# Patient Record
Sex: Female | Born: 1975 | Race: White | Hispanic: No | Marital: Married | Smoking: Never smoker
Health system: Southern US, Community
[De-identification: ages and names within clinical notes are randomized; demographics above are authoritative.]

## PROBLEM LIST (undated history)

## (undated) DIAGNOSIS — F32A Depression, unspecified: Secondary | ICD-10-CM

## (undated) DIAGNOSIS — I10 Essential (primary) hypertension: Secondary | ICD-10-CM

## (undated) DIAGNOSIS — F329 Major depressive disorder, single episode, unspecified: Secondary | ICD-10-CM

## (undated) DIAGNOSIS — E0590 Thyrotoxicosis, unspecified without thyrotoxic crisis or storm: Secondary | ICD-10-CM

## (undated) DIAGNOSIS — K649 Unspecified hemorrhoids: Secondary | ICD-10-CM

## (undated) HISTORY — DX: Depression, unspecified: F32.A

## (undated) HISTORY — DX: Major depressive disorder, single episode, unspecified: F32.9

## (undated) HISTORY — DX: Essential (primary) hypertension: I10

## (undated) HISTORY — DX: Unspecified hemorrhoids: K64.9

## (undated) HISTORY — DX: Thyrotoxicosis, unspecified without thyrotoxic crisis or storm: E05.90

## (undated) HISTORY — PX: COLONOSCOPY: SHX174

---
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
## 2007-10-03 ENCOUNTER — Ambulatory Visit: Payer: Self-pay | Admitting: Internal Medicine

## 2007-11-23 DIAGNOSIS — E0590 Thyrotoxicosis, unspecified without thyrotoxic crisis or storm: Secondary | ICD-10-CM

## 2007-11-23 HISTORY — DX: Thyrotoxicosis, unspecified without thyrotoxic crisis or storm: E05.90

## 2007-12-19 ENCOUNTER — Ambulatory Visit: Payer: Self-pay | Admitting: Internal Medicine

## 2010-01-27 ENCOUNTER — Ambulatory Visit: Payer: Self-pay | Admitting: Family Medicine

## 2010-12-23 HISTORY — PX: DILATION AND CURETTAGE OF UTERUS: SHX78

## 2010-12-28 ENCOUNTER — Ambulatory Visit: Payer: Self-pay

## 2011-01-07 ENCOUNTER — Ambulatory Visit: Payer: Self-pay

## 2011-01-08 LAB — PATHOLOGY REPORT

## 2011-08-23 HISTORY — PX: ABDOMINAL HYSTERECTOMY: SHX81

## 2013-01-03 ENCOUNTER — Ambulatory Visit: Payer: Self-pay | Admitting: Internal Medicine

## 2013-01-04 ENCOUNTER — Ambulatory Visit: Payer: Self-pay | Admitting: Internal Medicine

## 2013-01-25 ENCOUNTER — Telehealth: Payer: Self-pay | Admitting: Internal Medicine

## 2013-01-25 MED ORDER — METOPROLOL SUCCINATE ER 50 MG PO TB24: 50.0000 mg | ORAL_TABLET | Freq: Every day | ORAL | Status: DC

## 2013-01-25 NOTE — Telephone Encounter (Signed)
Pt is a previous pt of Dr. Roby Lofts.  Pt had appt to see Dr. Lorin Picket on 2/13 which was rescheduled due to weather.  Pt is not on schedule to see Dr. Lorin Picket as a New pt to this facility until 03/02/13.  Pt is out of her Toprol XL 50 mg twice a day.  [facility] or [facility] [address]  [facility] so that she can get the Rx sooner if Dr. Lorin Picket can fill this for her.

## 2013-01-25 NOTE — Telephone Encounter (Signed)
Filled script for 68 pills and until next appointment. Corrected one pill a day to bid with [facility]

## 2013-01-25 NOTE — Telephone Encounter (Signed)
Ok to refill toprol #30 here locally and can send in #90 no refills - to get her through to her appt

## 2013-01-25 NOTE — Telephone Encounter (Signed)
Dr. Lorin Picket,  Patient has no last ov and new patient appointment is 4/11. Will you authorize  the toprol xl 50 mg bid?   Asher Muir

## 2013-03-01 ENCOUNTER — Encounter: Payer: Self-pay | Admitting: Internal Medicine

## 2013-03-01 ENCOUNTER — Ambulatory Visit (INDEPENDENT_AMBULATORY_CARE_PROVIDER_SITE_OTHER): Payer: 59 | Admitting: Internal Medicine

## 2013-03-01 VITALS — BP 126/80 | HR 96 | Temp 97.9°F | Resp 18 | Ht 68.0 in | Wt 320.8 lb

## 2013-03-01 DIAGNOSIS — E7800 Pure hypercholesterolemia, unspecified: Secondary | ICD-10-CM

## 2013-03-01 DIAGNOSIS — E039 Hypothyroidism, unspecified: Secondary | ICD-10-CM

## 2013-03-01 DIAGNOSIS — R7309 Other abnormal glucose: Secondary | ICD-10-CM

## 2013-03-01 DIAGNOSIS — R739 Hyperglycemia, unspecified: Secondary | ICD-10-CM

## 2013-03-01 DIAGNOSIS — I10 Essential (primary) hypertension: Secondary | ICD-10-CM

## 2013-03-01 MED ORDER — LISINOPRIL-HYDROCHLOROTHIAZIDE 20-25 MG PO TABS: 1.0000 | ORAL_TABLET | Freq: Every day | ORAL | Status: DC

## 2013-03-01 MED ORDER — METOPROLOL SUCCINATE ER 50 MG PO TB24: 50.0000 mg | ORAL_TABLET | Freq: Two times a day (BID) | ORAL | Status: DC

## 2013-03-01 MED ORDER — LEVOTHYROXINE SODIUM 175 MCG PO TABS: 175.0000 ug | ORAL_TABLET | Freq: Every day | ORAL | Status: DC

## 2013-03-01 MED ORDER — SERTRALINE HCL 50 MG PO TABS: 50.0000 mg | ORAL_TABLET | Freq: Every day | ORAL | Status: DC

## 2013-03-02 ENCOUNTER — Ambulatory Visit: Payer: Self-pay | Admitting: Internal Medicine

## 2013-03-04 ENCOUNTER — Encounter: Payer: Self-pay | Admitting: Internal Medicine

## 2013-03-04 DIAGNOSIS — R739 Hyperglycemia, unspecified: Secondary | ICD-10-CM | POA: Insufficient documentation

## 2013-03-04 DIAGNOSIS — E7800 Pure hypercholesterolemia, unspecified: Secondary | ICD-10-CM | POA: Insufficient documentation

## 2013-03-04 DIAGNOSIS — I10 Essential (primary) hypertension: Secondary | ICD-10-CM | POA: Insufficient documentation

## 2013-03-04 DIAGNOSIS — E039 Hypothyroidism, unspecified: Secondary | ICD-10-CM | POA: Insufficient documentation

## 2013-03-04 NOTE — Assessment & Plan Note (Signed)
Had a history of hyperthyroidism and is s/p ablation.  Now hypothyroid.  Will check tsh. Continue thyroid replacement.

## 2013-03-04 NOTE — Assessment & Plan Note (Signed)
Low cholesterol diet and exercise.  Check lipid panel.   

## 2013-03-04 NOTE — Assessment & Plan Note (Signed)
Low carb diet, exercise and weight loss.  Check fasting glucose.  Follow.

## 2013-03-04 NOTE — Progress Notes (Signed)
  Subjective:    Patient ID: Jenna Winters, female    DOB: 1975-12-13, 37 y.o.   MRN: [id number]  HPI 37 year old female with past history of hypertension, hypercholesterolemia and hyperthyroidism s/p ablation with resulting hypothyroidism.  She comes in today for a scheduled follow up and to transfer her care here to [facility].  I previously saw her at [facility].  She states overall she is doing relatively well.  We had tapered her off her Zoloft.  She feels she needs to be back on it.  Increased stress at work.  She also is only taking one toprol per day.  Blood pressure averaging 120s/80.  No headache.  Breathing stable.  Is s/p hysterectomy.  Has done well.  Bowels stable.    Past Medical History  Diagnosis Date  . Hypertension   . Hyperthyroidism 2009    s/p ablation with resulting hypothyroidism  . Depression     Review of Systems Patient denies any headache, lightheadedness or dizziness.  No sinus or allergy symptoms.  No chest pain, tightness or palpitations.  No increased shortness of breath, cough or congestion.  No acid reflux.  No nausea or vomiting.  No abdominal pain or cramping.  No bowel change, such as diarrhea, constipation, BRBPR or melana.  No urine change.  Increased stress.  Feels she needs to be back on her zoloft.  Blood pressures as outlined.       Objective:   Physical Exam Filed Vitals:   03/01/13 1016  BP: 126/80  Pulse: 96  Temp: 97.9 F (36.6 C)  Resp: 18   Blood pressure recheck:  114/80, pulse 60  37 year old female in no acute distress.   HEENT:  Nares- clear.  Oropharynx - without lesions. NECK:  Supple.  Nontender.  No audible bruit.  HEART:  Appears to be regular. LUNGS:  No crackles or wheezing audible.  Respirations even and unlabored.  RADIAL PULSE:  Equal bilaterally.   ABDOMEN:  Soft, nontender.  Bowel sounds present and normal.  No audible abdominal bruit.   EXTREMITIES:  No increased edema present.  DP pulses palpable and equal  bilaterally.           Assessment & Plan:  GYN.  Is s/p hysterectomy.  Doing well.  Follow.   INCREASED PSYCHOSOCIAL STRESSORS.  Had tapered off Zoloft.  Increased stress at work.  Feels she needs to be back on the Zoloft.  Restart Zoloft 50mg  q day.  Follow.  Get her back in soon to reassess.    HEALTH MAINTENANCE.  GYN exams through Dr Luella Cook.  Need to obtain report of baseline mammogram.

## 2013-03-04 NOTE — Assessment & Plan Note (Signed)
Blood pressure as outlined.  Continue same med regimen. For now, will leave her on the Toprol XL q day.  Follow.  Check metabolic panel.

## 2013-04-30 ENCOUNTER — Telehealth: Payer: Self-pay | Admitting: Internal Medicine

## 2013-04-30 ENCOUNTER — Other Ambulatory Visit: Payer: Self-pay | Admitting: *Deleted

## 2013-04-30 MED ORDER — HYDROCORTISONE 2.5 % RE CREA: TOPICAL_CREAM | Freq: Two times a day (BID) | RECTAL | Status: DC | PRN

## 2013-04-30 NOTE — Telephone Encounter (Signed)
Not on current medication list-okay to refill?

## 2013-04-30 NOTE — Telephone Encounter (Signed)
Done-pt informed 

## 2013-04-30 NOTE — Telephone Encounter (Signed)
Pt states that she hasn't had to use it in a while. She usually uses the Preparation HC. She has an external Hemorrhoid x 5 days. Not getting any better with the otc cream.

## 2013-04-30 NOTE — Telephone Encounter (Signed)
Please find out what she is needing this for.  If having acute issues I need to see her.  Let me know and I will work her in.

## 2013-04-30 NOTE — Telephone Encounter (Signed)
Ok to call in one refill - proctosol hc 2.5% - apply to affected area bid  1 week prn.  Let me know if persistent problems.

## 2013-04-30 NOTE — Telephone Encounter (Signed)
Pt called to get refill on proctosol -hc 2.5%  [facility]

## 2013-05-01 ENCOUNTER — Other Ambulatory Visit: Payer: Self-pay | Admitting: Internal Medicine

## 2013-05-02 LAB — COMPREHENSIVE METABOLIC PANEL
AST: 16 IU/L (ref 0–40)
Alkaline Phosphatase: 91 IU/L (ref 39–117)
BUN/Creatinine Ratio: 16 (ref 8–20)
CO2: 23 mmol/L (ref 19–28)
Chloride: 99 mmol/L (ref 97–108)
GFR calc Af Amer: 114 mL/min/{1.73_m2} (ref >59)
Sodium: 138 mmol/L (ref 134–144)
Total Bilirubin: 0.5 mg/dL (ref 0.0–1.2)

## 2013-05-02 LAB — CBC WITH DIFFERENTIAL
Basophils Absolute: 0 10*3/uL (ref 0.0–0.2)
Basos: 0 % (ref 0–3)
Eos: 7 % — ABNORMAL HIGH (ref 0–5)
Eosinophils Absolute: 0.5 10*3/uL — ABNORMAL HIGH (ref 0.0–0.4)
HCT: 40.4 % (ref 34.0–46.6)
Hemoglobin: 13.8 g/dL (ref 11.1–15.9)
Immature Grans (Abs): 0 10*3/uL (ref 0.0–0.1)
Immature Granulocytes: 0 % (ref 0–2)
Lymphocytes Absolute: 2.5 10*3/uL (ref 0.7–3.1)
Lymphs: 32 % (ref 14–46)
MCH: 30.5 pg (ref 26.6–33.0)
MCHC: 34.2 g/dL (ref 31.5–35.7)
MCV: 89 fL (ref 79–97)
Monocytes Absolute: 0.5 10*3/uL (ref 0.1–0.9)
Monocytes: 6 % (ref 4–12)
Neutrophils Absolute: 4.3 10*3/uL (ref 1.4–7.0)
Neutrophils Relative %: 55 % (ref 40–74)
Platelets: 258 10*3/uL (ref 155–379)
RBC: 4.53 x10E6/uL (ref 3.77–5.28)
RDW: 13.7 % (ref 12.3–15.4)
WBC: 7.8 10*3/uL (ref 3.4–10.8)

## 2013-05-02 LAB — LIPID PANEL W/O CHOL/HDL RATIO
Cholesterol, Total: 192 mg/dL (ref 100–199)
LDL Calculated: 135 mg/dL — ABNORMAL HIGH (ref 0–99)
Triglycerides: 90 mg/dL (ref 0–149)

## 2013-05-03 ENCOUNTER — Encounter: Payer: Self-pay | Admitting: *Deleted

## 2013-05-07 ENCOUNTER — Encounter: Payer: Self-pay | Admitting: Adult Health

## 2013-05-07 ENCOUNTER — Telehealth: Payer: Self-pay | Admitting: Internal Medicine

## 2013-05-07 ENCOUNTER — Encounter: Payer: Self-pay | Admitting: Gastroenterology

## 2013-05-07 ENCOUNTER — Ambulatory Visit (INDEPENDENT_AMBULATORY_CARE_PROVIDER_SITE_OTHER): Payer: 59 | Admitting: Adult Health

## 2013-05-07 VITALS — BP 110/78 | HR 84 | Temp 98.5°F | Resp 12 | Wt 321.0 lb

## 2013-05-07 DIAGNOSIS — K625 Hemorrhage of anus and rectum: Secondary | ICD-10-CM

## 2013-05-07 DIAGNOSIS — K644 Residual hemorrhoidal skin tags: Secondary | ICD-10-CM

## 2013-05-07 NOTE — Assessment & Plan Note (Signed)
1 large external swollen hemorrhoid. She has been using hydrocortisone 2.5% cream x7 days which is the time period recommended. Use Preparation H., Sitz bath and refer to GI for further evaluation.

## 2013-05-07 NOTE — Telephone Encounter (Signed)
Patient Information:  Caller Name: [place]  Phone: [phone]  Patient: Jenna Winters  Gender: Female  DOB: [birth year]  Age: 37 Years  PCP: Dale Peyton  Pregnant: No  Office Follow Up:  Does the office need to follow up with this patient?: No  Instructions For The Office: N/A  RN Note:  Pt has blood after BM, Pt notices moderate amoun while wiping.  Pt denies dizziness or lightheadedness. Pt has been treated for hemorrhoids in the past but was advised against surgery.  All emergent symptoms ruled out per Rectal Bleeding protocol. Pt has appt at 1430 prior to call.  Symptoms  Reason For Call & Symptoms: ER CALL. Hemorrhoid Flare-up w/ Rectal bleeding.  Reviewed Health History In EMR: N/A  Reviewed Medications In EMR: N/A  Reviewed Allergies In EMR: N/A  Reviewed Surgeries / Procedures: N/A  Date of Onset of Symptoms: 05/05/2013  Treatments Tried: Proctosol  Treatments Tried Worked: No OB / GYN:  LMP: Unknown  Guideline(s) Used:  Rectal Bleeding  Disposition Per Guideline:   See Today in Office  Reason For Disposition Reached:   All other patients with rectal bleeding (Exceptions: blood just on toilet paper, few drops, streaks on surface of normal formed BM)  Advice Given:  N/A  Patient Will Follow Care Advice:  YES  Appointment Scheduled:  05/07/2013 14:30:00 Appointment Scheduled Provider:  N/A

## 2013-05-07 NOTE — Patient Instructions (Addendum)
  Use preparation H for your hemorrhoidal discomfort.  Continue eating high fiber diet.  Sit in cool water basin several times a day for relief.  I am referring you to GI for further evaluation and management.

## 2013-05-07 NOTE — Telephone Encounter (Signed)
Patient coming to see  Raquel today

## 2013-05-07 NOTE — Progress Notes (Signed)
  Subjective:    Patient ID: Jenna Winters, female    DOB: [birth year], 37 y.o.   MRN: [id number]  HPI  Patient is a pleasant 37 year old female who presents to clinic with complaints of rectal pain and bleeding for greater than one week. Patient has history of external hemorrhoids. She reports itching and great discomfort with sitting. She denies current constipation although she has had a history of this in the past. Denies any change in her diet.   Current Outpatient Prescriptions on File Prior to Visit  Medication Sig Dispense Refill  . hydrocortisone (ANUSOL-HC) 2.5 % rectal cream Place rectally 2 (two) times daily as needed for hemorrhoids.  30 g  0  . levothyroxine (SYNTHROID, LEVOTHROID) 175 MCG tablet Take 1 tablet (175 mcg total) by mouth daily before breakfast.  90 tablet  1  . lisinopril-hydrochlorothiazide (PRINZIDE,ZESTORETIC) 20-25 MG per tablet Take 1 tablet by mouth daily.  90 tablet  1  . metoprolol succinate (TOPROL-XL) 50 MG 24 hr tablet Take 1 tablet (50 mg total) by mouth 2 (two) times daily. Take with or immediately following a meal.  180 tablet  1  . sertraline (ZOLOFT) 50 MG tablet Take 1 tablet (50 mg total) by mouth daily.  90 tablet  1   No current facility-administered medications on file prior to visit.     Review of Systems  Constitutional: Negative for fever and chills.  Gastrointestinal: Positive for blood in stool, anal bleeding and rectal pain. Negative for nausea, abdominal pain, constipation and abdominal distention.       Hemorrhoid       Objective:   Physical Exam  Constitutional: She is oriented to person, place, and time.  Overweight, pleasant female in no apparent distress  Genitourinary:  1 large external hemorrhoid erythematous and inflamed  Neurological: She is alert and oriented to person, place, and time.  Psychiatric: She has a normal mood and affect. Her behavior is normal. Judgment and thought content normal.           Assessment & Plan:

## 2013-05-15 ENCOUNTER — Ambulatory Visit (INDEPENDENT_AMBULATORY_CARE_PROVIDER_SITE_OTHER): Payer: 59 | Admitting: Gastroenterology

## 2013-05-15 ENCOUNTER — Encounter: Payer: Self-pay | Admitting: Gastroenterology

## 2013-05-15 VITALS — BP 106/80 | HR 82 | Ht 68.0 in | Wt 322.4 lb

## 2013-05-15 DIAGNOSIS — K6289 Other specified diseases of anus and rectum: Secondary | ICD-10-CM

## 2013-05-15 DIAGNOSIS — K644 Residual hemorrhoidal skin tags: Secondary | ICD-10-CM

## 2013-05-15 MED ORDER — NA SULFATE-K SULFATE-MG SULF 17.5-3.13-1.6 GM/177ML PO SOLN: ORAL | Status: DC

## 2013-05-15 MED ORDER — AMBULATORY NON FORMULARY MEDICATION: Status: DC

## 2013-05-15 NOTE — Progress Notes (Signed)
History of Present Illness:  This is a 37 year old Caucasian female who has had many years of" hemorrhoids" with recurrent rectal bleeding, rectal pain, and apparently has had a large posterior skin tag for many years with consideration of surgical removal several years ago.  She denies constipation, but has recently had bleeding, swelling, and discomfort in her anal and rectal area all alleviated with Anusol-HC cream and suppositories along with Preparation H.  She currently doing much better symptomatically but continues to have a large protruding skin tag in the anal area.  She denies abdominal pain, upper GI or hepatobiliary complaints.  Her appetite is good her weight is been stable.  There is no family history of colon cancer or colon polyps, but multiple members with hemorrhoids.  She's not had previous barium enema or colonoscopy exam.  I have reviewed this patient's present history, medical and surgical past history, allergies and medications.     ROS:   All systems were reviewed and are negative unless otherwise stated in the HPI... no shortness of breath, chest pain with exertion, history of gallbladder liver disease or other complications of obesity.  No Known Allergies Outpatient Prescriptions Prior to Visit  Medication Sig Dispense Refill  . hydrocortisone (ANUSOL-HC) 2.5 % rectal cream Place rectally 2 (two) times daily as needed for hemorrhoids.  30 g  0  . levothyroxine (SYNTHROID, LEVOTHROID) 175 MCG tablet Take 1 tablet (175 mcg total) by mouth daily before breakfast.  90 tablet  1  . lisinopril-hydrochlorothiazide (PRINZIDE,ZESTORETIC) 20-25 MG per tablet Take 1 tablet by mouth daily.  90 tablet  1  . metoprolol succinate (TOPROL-XL) 50 MG 24 hr tablet Take 1 tablet (50 mg total) by mouth 2 (two) times daily. Take with or immediately following a meal.  180 tablet  1  . sertraline (ZOLOFT) 50 MG tablet Take 1 tablet (50 mg total) by mouth daily.  90 tablet  1   No  facility-administered medications prior to visit.   Past Medical History  Diagnosis Date  . Hypertension   . Hyperthyroidism 2009    s/p ablation with resulting hypothyroidism  . Depression    Past Surgical History  Procedure Laterality Date  . Abdominal hysterectomy  08/2011  . Dilation and curettage of uterus  12/2010  . Dilation and curettage of uterus  1995   History   Social History  . Marital Status: Single    Spouse Name: N/A    Number of Children: 1  . 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 Smoking status: Never Smoker   . Smokeless tobacco: Never Used  . Alcohol Use: No  . Drug Use: No  . Sexually Active: None   Other Topics Concern  . None   Social History Narrative  . None   Family History  Problem Relation Age of Onset  . Hyperlipidemia Father   . Hypertension Father   . Diabetes Father   . Hyperlipidemia Paternal Grandmother   . Hypertension Paternal Grandmother   . Diabetes Paternal Grandmother   . Hyperlipidemia Paternal Grandfather   . Hypertension Paternal Grandfather   . Diabetes Paternal Grandfather   . Lung cancer Paternal Grandfather        Physical Exam: General well developed well nourished patient in no acute distress, appearing her stated age Eyes PERRLA, no icterus, fundoscopic exam per opthamologist Skin no lesions noted Neck supple, no adenopathy, no thyroid enlargement, no tenderness  Chest clear to percussion and auscultation Heart no significant murmurs, gallops or rubs noted Abdomen no hepatosplenomegaly masses or tenderness, BS normal.  Rectal inspection normal no fissures, or fistulae noted.  No masses or tenderness on digital exam. Stool guaiac negative.  There is a large posterior skin tag measuring approximately 2 cm x 0.5 cm.  This is easy maneuverable and is not fluctuant, but is slightly tender.  Anoscopic exam was difficult but I can see no active  seizure or any active bulging internal hemorrhoids.  Stool is guaiac-negative. Extremities no acute joint lesions, edema, phlebitis or evidence of cellulitis. Neurologic patient oriented x 3, cranial nerves intact, no focal neurologic deficits noted. Psychological mental status normal and normal affect.  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  I really cannot see any large external and internal hemorrhoids at this time.  I have set her up for colonoscopy to exclude other causes of rectal bleeding, and prescribe 0.125% nitroglycerin cream 3 times a day likely should she develop spasm and irritation of her anal area which I think is from recurrent seizures and her large skin tag.  We may need to give consideration to surgical removal of this anatomical source of her recurrent rectal pain.  I placed her on a high fiber diet with daily Benefiber 1 tablespoon twice a day, also patient information concerning hemorrhoids, fissures and their management.  At the time of colonoscopy for been irrigated better view of her rectum and anal canal area.  She was somewhat tender today making exam difficult today.  Patient does have morbid obesity with weight 322 pounds and BMI of 49.03.  She's had no complications from her obesity, and review of her labs shows normal CBC, metabolic and liver function tests.  No diagnosis found.

## 2013-05-15 NOTE — Patient Instructions (Signed)
You have been scheduled for a colonoscopy with propofol. Please follow written instructions given to you at your visit today.  Please pick up your prep kit at the pharmacy within the next 1-3 days. If you use inhalers (even only as needed), please bring them with you on the day of your procedure. Your physician has requested that you go to www.startemmi.com and enter the access code given to you at your visit today. This web site gives a general overview about your procedure. However, you should still follow specific instructions given to you by our office regarding your preparation for the procedure.  We have sent the following medications to [facility] for you to pick up at your convenience: Nitroglycerine ointment 0.125 %, please use as directed  Please purchase benefiber over the counter and take one tablespoon twice daily.  You watched a video today on hemorrhoids and information is below. Information on High Fiber diet is below for your information. __________________________________________________________________________________________________________________  Hemorrhoids Hemorrhoids are swollen veins around the rectum or anus. There are two types of hemorrhoids:   Internal hemorrhoids. These occur in the veins just inside the rectum. They may poke through to the outside and become irritated and painful.  External hemorrhoids. These occur in the veins outside the anus and can be felt as a painful swelling or hard lump near the anus. CAUSES  Pregnancy.   Obesity.   Constipation or diarrhea.   Straining to have a bowel movement.   Sitting for long periods on the toilet.  Heavy lifting or other activity that caused you to strain.  Anal intercourse. SYMPTOMS   Pain.   Anal itching or irritation.   Rectal bleeding.   Fecal leakage.   Anal swelling.   One or more lumps around the anus.  DIAGNOSIS  Your caregiver may be able to diagnose hemorrhoids  by visual examination. Other examinations or tests that may be performed include:   Examination of the rectal area with a gloved hand (digital rectal exam).   Examination of anal canal using a small tube (scope).   A blood test if you have lost a significant amount of blood.  A test to look inside the colon (sigmoidoscopy or colonoscopy). TREATMENT Most hemorrhoids can be treated at home. However, if symptoms do not seem to be getting better or if you have a lot of rectal bleeding, your caregiver may perform a procedure to help make the hemorrhoids get smaller or remove them completely. Possible treatments include:   Placing a rubber band at the base of the hemorrhoid to cut off the circulation (rubber band ligation).   Injecting a chemical to shrink the hemorrhoid (sclerotherapy).   Using a tool to burn the hemorrhoid (infrared light therapy).   Surgically removing the hemorrhoid (hemorrhoidectomy).   Stapling the hemorrhoid to block blood flow to the tissue (hemorrhoid stapling).  HOME CARE INSTRUCTIONS   Eat foods with fiber, such as whole grains, beans, nuts, fruits, and vegetables. Ask your doctor about taking products with added fiber in them (fibersupplements).  Increase fluid intake. Drink enough water and fluids to keep your urine clear or pale yellow.   Exercise regularly.   Go to the bathroom when you have the urge to have a bowel movement. Do not wait.   Avoid straining to have bowel movements.   Keep the anal area dry and clean. Use wet toilet paper or moist towelettes after a bowel movement.   Medicated creams and suppositories may be used or  applied as directed.   Only take over-the-counter or prescription medicines as directed by your caregiver.   Take warm sitz baths for 15 20 minutes, 3 4 times a day to ease pain and discomfort.   Place ice packs on the hemorrhoids if they are tender and swollen. Using ice packs between sitz baths may be  helpful.   Put ice in a plastic bag.   Place a towel between your skin and the bag.   Leave the ice on for 15 20 minutes, 3 4 times a day.   Do not use a donut-shaped pillow or sit on the toilet for long periods. This increases blood pooling and pain.  SEEK MEDICAL CARE IF:  You have increasing pain and swelling that is not controlled by treatment or medicine.  You have uncontrolled bleeding.  You have difficulty or you are unable to have a bowel movement.  You have pain or inflammation outside the area of the hemorrhoids. MAKE SURE YOU:  Understand these instructions.  Will watch your condition.  Will get help right away if you are not doing well or get worse. Document Released: 11/05/2000 Document Revised: 10/25/2012 Document Reviewed: 09/12/2012 ExitCare Patient Information 2014 ExitCare, [place]. _______________________________________________________________________  High-Fiber Diet Fiber is found in fruits, vegetables, and grains. A high-fiber diet encourages the addition of more whole grains, legumes, fruits, and vegetables in your diet. The recommended amount of fiber for adult males is 38 g per day. For adult females, it is 25 g per day. Pregnant and lactating women should get 28 g of fiber per day. If you have a digestive or bowel problem, ask your caregiver for advice before adding high-fiber foods to your diet. Eat a variety of high-fiber foods instead of only a select few type of foods.  PURPOSE  To increase stool bulk.  To make bowel movements more regular to prevent constipation.  To lower cholesterol.  To prevent overeating. WHEN IS THIS DIET USED?  It may be used if you have constipation and hemorrhoids.  It may be used if you have uncomplicated diverticulosis (intestine condition) and irritable bowel syndrome.  It may be used if you need help with weight management.  It may be used if you want to add it to your diet as a protective measure against  atherosclerosis, diabetes, and cancer. SOURCES OF FIBER  Whole-grain breads and cereals.  Fruits, such as apples, oranges, bananas, berries, prunes, and pears.  Vegetables, such as green peas, carrots, sweet potatoes, beets, broccoli, cabbage, spinach, and artichokes.  Legumes, such split peas, soy, lentils.  Almonds. FIBER CONTENT IN FOODS Starches and Grains / Dietary Fiber (g)  Cheerios, 1 cup / 3 g  Corn Flakes cereal, 1 cup / 0.7 g  Rice crispy treat cereal, 1 cup / 0.3 g  Instant oatmeal (cooked),  cup / 2 g  Frosted wheat cereal, 1 cup / 5.1 g  Brown, long-grain rice (cooked), 1 cup / 3.5 g  White, long-grain rice (cooked), 1 cup / 0.6 g  Enriched macaroni (cooked), 1 cup / 2.5 g Legumes / Dietary Fiber (g)  Baked beans (canned, plain, or vegetarian),  cup / 5.2 g  Kidney beans (canned),  cup / 6.8 g  Pinto beans (cooked),  cup / 5.5 g Breads and Crackers / Dietary Fiber (g)  Plain or honey graham crackers, 2 squares / 0.7 g  Saltine crackers, 3 squares / 0.3 g  Plain, salted pretzels, 10 pieces / 1.8 g  Whole-wheat bread, 1  slice / 1.9 g  White bread, 1 slice / 0.7 g  Raisin bread, 1 slice / 1.2 g  Plain bagel, 3 oz / 2 g  Flour tortilla, 1 oz / 0.9 g  Corn tortilla, 1 small / 1.5 g  Hamburger or hotdog bun, 1 small / 0.9 g Fruits / Dietary Fiber (g)  Apple with skin, 1 medium / 4.4 g  Sweetened applesauce,  cup / 1.5 g  Banana,  medium / 1.5 g  Grapes, 10 grapes / 0.4 g  Orange, 1 small / 2.3 g  Raisin, 1.5 oz / 1.6 g  Melon, 1 cup / 1.4 g Vegetables / Dietary Fiber (g)  Green beans (canned),  cup / 1.3 g  Carrots (cooked),  cup / 2.3 g  Broccoli (cooked),  cup / 2.8 g  Peas (cooked),  cup / 4.4 g  Mashed potatoes,  cup / 1.6 g  Lettuce, 1 cup / 0.5 g  Corn (canned),  cup / 1.6 g  Tomato,  cup / 1.1 g Document Released: 11/08/2005 Document Revised: 05/09/2012 Document Reviewed: 02/10/2012 ExitCare  Patient Information 2014 [place]. _______________________________________________________________________________________________                                               We are excited to introduce MyChart, a new best-in-class service that provides you online access to important information in your electronic medical record. We want to make it easier for you to view your health information - all in one secure location - when and where you need it. We expect MyChart will enhance the quality of care and service we provide.  When you register for MyChart, you can:    View your test results.    Request appointments and receive appointment reminders via email.    Request medication renewals.    View your medical history, allergies, medications and immunizations.    Communicate with your physician's office through a password-protected site.    Conveniently print information such as your medication lists.  To find out if MyChart is right for you, please talk to a member of our clinical staff today. We will gladly answer your questions about this free health and wellness tool.  If you are age 38 or older and want a member of your family to have access to your record, you must provide written consent by completing a proxy form available at our office. Please speak to our clinical staff about guidelines regarding accounts for patients younger than age 34.  As you activate your MyChart account and need any technical assistance, please call the MyChart technical support line at [phone]) or email your question to [email or web address]. If you email your question(s), please include your name, a return phone number and the best time to reach you.  If you have non-urgent health-related questions, you can send a message to our office through MyChart at [email or web address]. If you have a medical emergency, call 911.  Thank you for using MyChart as your new health  and wellness resource!   MyChart licensed from [employer],  [id number]. Patents Pending.

## 2013-05-21 ENCOUNTER — Encounter: Payer: Self-pay | Admitting: Gastroenterology

## 2013-05-21 ENCOUNTER — Ambulatory Visit (AMBULATORY_SURGERY_CENTER): Payer: 59 | Admitting: Gastroenterology

## 2013-05-21 VITALS — BP 147/73 | HR 77 | Temp 98.5°F | Resp 17 | Ht 68.0 in | Wt 322.0 lb

## 2013-05-21 DIAGNOSIS — K644 Residual hemorrhoidal skin tags: Secondary | ICD-10-CM

## 2013-05-21 DIAGNOSIS — K625 Hemorrhage of anus and rectum: Secondary | ICD-10-CM

## 2013-05-21 DIAGNOSIS — Z1211 Encounter for screening for malignant neoplasm of colon: Secondary | ICD-10-CM

## 2013-05-21 MED ORDER — SODIUM CHLORIDE 0.9 % IV SOLN: 500.0000 mL | INTRAVENOUS | Status: DC

## 2013-05-21 NOTE — Op Note (Signed)
[facility] [address] [place], [address]   COLONOSCOPY PROCEDURE REPORT  PATIENT: Jenna, Winters  MR#: [id number] BIRTHDATE: 21-Jan-1976 , 36  yrs. old GENDER: Female ENDOSCOPIST: Mardella Layman, MD, [facility] REFERRED BY: PROCEDURE DATE:  05/21/2013 PROCEDURE:   Colonoscopy, screening ASA CLASS:   Class III INDICATIONS:Average risk patient for colon cancer, Rectal Bleeding, and anal pain. MEDICATIONS: propofol (Diprivan) 300mg  IV  DESCRIPTION OF PROCEDURE:   After the risks and benefits and of the procedure were explained, informed consent was obtained.  A digital rectal exam revealed hemorrhoids.    The LB [id number] endoscope was introduced through the anus and advanced to the cecum, which was identified by both the appendix and ileocecal valve .  The quality of the prep was excellent, using MoviPrep . The instrument was then slowly withdrawn as the colon was fully examined.     COLON FINDINGS: External hemorrhoids were found.  Large perianal skin tag noted,tender,see pictures.  The colon was otherwise normal.  There was no diverticulosis, inflammation, polyps or cancers unless previously stated.     Retroflexed views revealed external hemorrhoids.     The scope was then withdrawn from the patient and the procedure completed.  COMPLICATIONS: There were no complications. ENDOSCOPIC IMPRESSION: 1.   External hemorrhoids and prominant perianal skin tag... 2.   The colon was otherwise normal ...no polyps,colitis noted  RECOMMENDATIONS: 1.  Continue current medications 2.  High fiber diet 3.  Metamucil or benefiber 4.  Continue current colorectal screening recommendations for "routine risk" patients with a repeat colonoscopy in 10 years. 5. Local anal care  REPEAT EXAM:  cc:  _______________________________ eSignedMardella Layman, MD, [facility] 05/21/2013 9:57 AM     PATIENT NAME:  Jenna Winters MR#: [id number]

## 2013-05-21 NOTE — Patient Instructions (Addendum)

## 2013-05-21 NOTE — Progress Notes (Signed)
Patient did not experience any of the following events: a burn prior to discharge; a fall within the facility; wrong site/side/patient/procedure/implant event; or a hospital transfer or hospital admission upon discharge from the facility. (G8907) Patient did not have preoperative order for IV antibiotic SSI prophylaxis. (G8918)  

## 2013-05-22 ENCOUNTER — Telehealth: Payer: Self-pay | Admitting: *Deleted

## 2013-05-22 NOTE — Telephone Encounter (Signed)
  Follow up Call-  Call back number 05/21/2013  Post procedure Call Back phone  # [phone]  Permission to leave phone message Yes     Patient questions:  Do you have a fever, pain , or abdominal swelling? no Pain Score  0 *  Have you tolerated food without any problems? yes  Have you been able to return to your normal activities? yes  Do you have any questions about your discharge instructions: Diet   no Medications  no Follow up visit  no  Do you have questions or concerns about your Care? no  Actions: * If pain score is 4 or above: No action needed, pain <4.

## 2013-05-23 ENCOUNTER — Telehealth: Payer: Self-pay | Admitting: *Deleted

## 2013-05-23 DIAGNOSIS — K644 Residual hemorrhoidal skin tags: Secondary | ICD-10-CM

## 2013-05-23 NOTE — Telephone Encounter (Signed)
Informed pt of [facility] appt on 06/04/13 with Dr Harlon Flor, be there at 2:20pm. Pt stated understanding.

## 2013-05-31 ENCOUNTER — Encounter: Payer: Self-pay | Admitting: Internal Medicine

## 2013-05-31 ENCOUNTER — Ambulatory Visit (INDEPENDENT_AMBULATORY_CARE_PROVIDER_SITE_OTHER): Payer: 59 | Admitting: Internal Medicine

## 2013-05-31 VITALS — BP 104/70 | HR 80 | Temp 98.8°F | Ht 68.0 in | Wt 324.2 lb

## 2013-05-31 DIAGNOSIS — E7800 Pure hypercholesterolemia, unspecified: Secondary | ICD-10-CM

## 2013-05-31 DIAGNOSIS — I10 Essential (primary) hypertension: Secondary | ICD-10-CM

## 2013-05-31 DIAGNOSIS — K644 Residual hemorrhoidal skin tags: Secondary | ICD-10-CM

## 2013-05-31 DIAGNOSIS — R7309 Other abnormal glucose: Secondary | ICD-10-CM

## 2013-05-31 DIAGNOSIS — R739 Hyperglycemia, unspecified: Secondary | ICD-10-CM

## 2013-05-31 DIAGNOSIS — E039 Hypothyroidism, unspecified: Secondary | ICD-10-CM

## 2013-05-31 MED ORDER — ZOLPIDEM TARTRATE 5 MG PO TABS: 5.0000 mg | ORAL_TABLET | Freq: Every evening | ORAL | Status: DC | PRN

## 2013-06-03 ENCOUNTER — Encounter: Payer: Self-pay | Admitting: Internal Medicine

## 2013-06-03 NOTE — Assessment & Plan Note (Signed)
Low carb diet, exercise and weight loss.  Follow.  

## 2013-06-03 NOTE — Assessment & Plan Note (Signed)
Had a history of hyperthyroidism and is s/p ablation.  Now hypothyroid.  Continue thyroid replacement.  Follow tsh.    

## 2013-06-03 NOTE — Progress Notes (Signed)
  Subjective:    Patient ID: Jenna Winters, female    DOB: 05/27/1976, 37 y.o.   MRN: [id number]  HPI 37 year old female with past history of hypertension, hypercholesterolemia and hyperthyroidism s/p ablation with resulting hypothyroidism.  She comes in today for a scheduled follow up.  She was experiencing Increased stress at work.  Was started back on zoloft last visit.  Doing better.  No headache.  Breathing stable.  Is s/p hysterectomy.  Has done well.  Bowels stable.  Was evaluated for hemorrhoid.  See Raquel's note and Dr Norval Gable note for details.  He gave her NTG.  Has f/u next week with [facility].  Planning for possible intervention.  Is better now, but still has some issues.  She is in a cycle of not sleeping now.  Feels she needs something to help break the cycle.  Does feel better on the zoloft.     Past Medical History  Diagnosis Date  . Hypertension   . Hyperthyroidism 2009    s/p ablation with resulting hypothyroidism  . Depression     Review of Systems Patient denies any headache, lightheadedness or dizziness.  No sinus or allergy symptoms.  No chest pain, tightness or palpitations.  No increased shortness of breath, cough or congestion.  No acid reflux.  No nausea or vomiting.  No abdominal pain or cramping.  No bowel change, such as diarrhea, constipation, BRBPR or melana.  No urine change.  Increased stress.  Doing better back on the Zoloft.  Not sleeping.  See above.       Objective:   Physical Exam  Filed Vitals:   05/31/13 0949  BP: 104/70  Pulse: 80  Temp: 98.8 F (37.1 C)   Blood pressure recheck:  112/80, pulse 66  37 year old female in no acute distress.   HEENT:  Nares- clear.  Oropharynx - without lesions. NECK:  Supple.  Nontender.  No audible bruit.  HEART:  Appears to be regular. LUNGS:  No crackles or wheezing audible.  Respirations even and unlabored.  RADIAL PULSE:  Equal bilaterally.   ABDOMEN:  Soft, nontender.  Bowel sounds  present and normal.  No audible abdominal bruit.   EXTREMITIES:  No increased edema present.  DP pulses palpable and equal bilaterally.           Assessment & Plan:  GYN.  Is s/p hysterectomy.  Doing well.  Follow.   INCREASED PSYCHOSOCIAL STRESSORS.  Back on Zoloft and doing better.  Not sleeping.  Will give her a prescription for ambien to have to break the cycle.  She has taken this previously and tolerated.  Follow.     HEALTH MAINTENANCE.  GYN exams through Dr Luella Cook.

## 2013-06-03 NOTE — Assessment & Plan Note (Signed)
Had a colonoscopy 05/21/13.  Found to have external hemorrhoid otherwise ok.  Planning to see Dr Romie Levee next week to f/u regarding the hemorrhoid.

## 2013-06-03 NOTE — Assessment & Plan Note (Signed)
Blood pressure as outlined.  Continue same med regimen. Follow.   

## 2013-06-03 NOTE — Assessment & Plan Note (Signed)
Low cholesterol diet and exercise.  Follow lipid panel.   

## 2013-06-04 ENCOUNTER — Ambulatory Visit (INDEPENDENT_AMBULATORY_CARE_PROVIDER_SITE_OTHER): Payer: Self-pay | Admitting: Surgery

## 2013-06-06 ENCOUNTER — Ambulatory Visit (INDEPENDENT_AMBULATORY_CARE_PROVIDER_SITE_OTHER): Payer: 59 | Admitting: General Surgery

## 2013-06-06 ENCOUNTER — Encounter (INDEPENDENT_AMBULATORY_CARE_PROVIDER_SITE_OTHER): Payer: Self-pay | Admitting: General Surgery

## 2013-06-06 VITALS — BP 128/72 | HR 72 | Temp 99.0°F | Resp 16 | Ht 68.0 in | Wt 322.8 lb

## 2013-06-06 DIAGNOSIS — K644 Residual hemorrhoidal skin tags: Secondary | ICD-10-CM

## 2013-06-06 NOTE — Progress Notes (Signed)
Chief Complaint  Patient presents with  . New Evaluation    eval anal skin tag    HISTORY: Jenna Winters is a 37 y.o. female who presents to the office with rectal bleeding about a month ago.  This caused her anal skin tag to swell and become very painful.  This is getting better.  Other symptoms include itching.  This had been occurring for a few weeks.  She has tried prep H and anusol in the past with good success.  She is not sure what makes the symptoms worse.   It is intermittent in nature.  Her bowel habits are regular and her bowel movements are soft.  She denies straining.  Her fiber intake is through a fiber supplement.  Her last colonoscopy was recently and showed no other reason for her bleeding.   Past Medical History  Diagnosis Date  . Hypertension   . Hyperthyroidism 2009    s/p ablation with resulting hypothyroidism  . Depression       Past Surgical History  Procedure Laterality Date  . Abdominal hysterectomy  08/2011  . Dilation and curettage of uterus  12/2010  . Dilation and curettage of uterus  1995  . Colonoscopy          Current Outpatient Prescriptions  Medication Sig Dispense Refill  . hydrocortisone (ANUSOL-HC) 2.5 % rectal cream Place rectally 2 (two) times daily as needed for hemorrhoids.  30 g  0  . levothyroxine (SYNTHROID, LEVOTHROID) 175 MCG tablet Take 1 tablet (175 mcg total) by mouth daily before breakfast.  90 tablet  1  . lisinopril-hydrochlorothiazide (PRINZIDE,ZESTORETIC) 20-25 MG per tablet Take 1 tablet by mouth daily.  90 tablet  1  . metoprolol succinate (TOPROL-XL) 50 MG 24 hr tablet Take 1 tablet (50 mg total) by mouth 2 (two) times daily. Take with or immediately following a meal.  180 tablet  1  . sertraline (ZOLOFT) 50 MG tablet Take 1 tablet (50 mg total) by mouth daily.  90 tablet  1  . zolpidem (AMBIEN) 5 MG tablet Take 1 tablet (5 mg total) by mouth at bedtime as needed for sleep.  15 tablet  0  . AMBULATORY NON FORMULARY MEDICATION  Medication Name: Nitroglycerine ointment 0.125 %  Apply a pea sized amount internally two to three times a day Dispense 30 GM zero refill  30 g  1   No current facility-administered medications for this visit.      No Known Allergies    Family History  Problem Relation Age of Onset  . Hyperlipidemia Father   . Hypertension Father   . Diabetes Father   . Hyperlipidemia Paternal Grandmother   . Hypertension Paternal Grandmother   . Diabetes Paternal Grandmother   . Hyperlipidemia Paternal Grandfather   . Hypertension Paternal Grandfather   . Diabetes Paternal Grandfather   . Lung cancer Paternal Grandfather   . COPD Paternal Grandfather     lung    History   Social History  . Marital Status: Single    Spouse Name: N/A    Number of Children: 1  . 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 Smoking status: Never Smoker   . Smokeless tobacco: Never Used  . Alcohol Use: Yes     Comment: rare occasion  . Drug Use: No  . Sexually Active: None   Other Topics Concern  . None   Social History Narrative  .  None      REVIEW OF SYSTEMS - PERTINENT POSITIVES ONLY: Review of Systems - General ROS: negative for - chills, fever or weight loss Hematological and Lymphatic ROS: negative for - bleeding problems, blood clots or bruising Respiratory ROS: no cough, shortness of breath, or wheezing Cardiovascular ROS: no chest pain or dyspnea on exertion Gastrointestinal ROS: positive for - blood in stools negative for - abdominal pain or change in bowel habits Genito-Urinary ROS: no dysuria, trouble voiding, or hematuria  EXAM: Filed Vitals:   06/06/13 1124  BP: 128/72  Pulse: 72  Temp: 99 F (37.2 C)  Resp: 16    General appearance: alert and cooperative Resp: clear to auscultation bilaterally Cardio: regular rate and rhythm GI: soft, non-tender; bowel sounds normal; no masses,  no organomegaly   Procedure:  Anoscopy Surgeon: Maisie Fus Diagnosis: anal pain  Assistant: Christella Scheuermann After the risks and benefits were explained, verbal consent was obtained for above procedure  Anesthesia: none Findings: large right posterior skin tag, grade 2 internal hemorrhoids.      ASSESSMENT AND PLAN: Jenna Winters is a 37 y.o. F with a recent hemorrhoid flare.  This is doing better on the fiber, but she is concerned that she will continue to have flares.  On exam she has a rather large skin tag.  I think it is reasonable to consider removal.  We discussed the risks and benefits of surgery, which are mainly pain, bleeding and recurrence.  She understands the importance of having regular BM's post op as well.  She will continue her fiber supplements, and I will perform a hemorrhoidectomy.  Patient agrees to this plan.    Vanita Panda, MD Colon and Rectal Surgery / General Surgery [facility]      Visit Diagnoses: 1. External hemorrhoid     Primary Care Physician: Charm Barges, MD

## 2013-06-06 NOTE — Patient Instructions (Signed)

## 2013-06-11 ENCOUNTER — Telehealth (INDEPENDENT_AMBULATORY_CARE_PROVIDER_SITE_OTHER): Payer: Self-pay | Admitting: General Surgery

## 2013-06-11 NOTE — Telephone Encounter (Signed)
Met with surgery scheduling went over financial responsible, patient will call back to schedule.

## 2013-06-14 ENCOUNTER — Encounter: Payer: Self-pay | Admitting: Internal Medicine

## 2013-08-21 ENCOUNTER — Telehealth (INDEPENDENT_AMBULATORY_CARE_PROVIDER_SITE_OTHER): Payer: Self-pay

## 2013-08-21 NOTE — Telephone Encounter (Signed)
Patient is asking if there is a bowel prep before surgery, She states Dr. Maisie Fus mentioned something about it. Please advise

## 2013-08-21 NOTE — Telephone Encounter (Signed)
I called the pt and had to leave a message to call us.  There is no bowel prep.

## 2013-08-22 HISTORY — PX: HEMORRHOID SURGERY: SHX153

## 2013-08-23 NOTE — Telephone Encounter (Signed)
Patient called back and was given below message.  Patient states understanding and agreeable at this time. 

## 2013-08-27 ENCOUNTER — Other Ambulatory Visit (INDEPENDENT_AMBULATORY_CARE_PROVIDER_SITE_OTHER): Payer: Self-pay | Admitting: *Deleted

## 2013-08-27 ENCOUNTER — Other Ambulatory Visit: Payer: Self-pay

## 2013-08-27 DIAGNOSIS — K644 Residual hemorrhoidal skin tags: Secondary | ICD-10-CM

## 2013-08-27 DIAGNOSIS — K648 Other hemorrhoids: Secondary | ICD-10-CM

## 2013-08-27 MED ORDER — LIDOCAINE HCL 2 % EX GEL: CUTANEOUS | Status: DC

## 2013-08-27 MED ORDER — DIAZEPAM 5 MG PO TABS: 5.0000 mg | ORAL_TABLET | Freq: Four times a day (QID) | ORAL | Status: DC | PRN

## 2013-08-27 MED ORDER — OXYCODONE-ACETAMINOPHEN 5-325 MG PO TABS: 1.0000 | ORAL_TABLET | ORAL | Status: DC | PRN

## 2013-09-11 ENCOUNTER — Encounter (INDEPENDENT_AMBULATORY_CARE_PROVIDER_SITE_OTHER): Payer: Self-pay | Admitting: General Surgery

## 2013-09-11 ENCOUNTER — Ambulatory Visit (INDEPENDENT_AMBULATORY_CARE_PROVIDER_SITE_OTHER): Payer: 59 | Admitting: General Surgery

## 2013-09-11 VITALS — BP 128/90 | HR 90 | Temp 98.6°F | Wt 315.0 lb

## 2013-09-11 DIAGNOSIS — Z9889 Other specified postprocedural states: Secondary | ICD-10-CM

## 2013-09-11 NOTE — Progress Notes (Signed)
Jenna Winters is a 37 y.o. female who is status post a hemorrhoidectomy on 10/8.  She is having increased pain over the past few days.  She is using the sitz baths and lidocaine cream.  She is having several BM's a day.  Hasn't been able to sleep much in the past few days.   Objective: There were no vitals filed for this visit.  General appearance: alert and cooperative GI: normal findings: soft, non-tender  Incision: appropriate healing, no signs of infection or abscess, unable to do DRE due to pain   Assessment: s/p  Patient Active Problem List   Diagnosis Date Noted  . External hemorrhoid 05/07/2013  . Essential hypertension, benign 03/04/2013  . Hyperglycemia 03/04/2013  . Hypercholesterolemia 03/04/2013  . Hypothyroidism (acquired) 03/04/2013   Path: benign hemorrhoid tissue   Plan: Recommended continuing treatment for now.  Will try Benadryl for sleep.  RTO in 2 weeks    .Vanita Panda, MD [facility], [place] [phone]   09/11/2013 9:34 AM

## 2013-09-11 NOTE — Patient Instructions (Signed)
Continue sitz baths and lidocaine cream for pain.  Use oxycodone as needed when these don't work.

## 2013-09-14 ENCOUNTER — Encounter (INDEPENDENT_AMBULATORY_CARE_PROVIDER_SITE_OTHER): Payer: Self-pay

## 2013-09-14 ENCOUNTER — Telehealth (INDEPENDENT_AMBULATORY_CARE_PROVIDER_SITE_OTHER): Payer: Self-pay

## 2013-09-14 NOTE — Telephone Encounter (Signed)
Ok, to go back to work on [employer].  Will send a note.

## 2013-09-14 NOTE — Telephone Encounter (Signed)
Pt called to say that she is ready to return to work on Monday, October 27.  Note can be faxed to [phone] ATTN: Kristen Loader.  Pt would also like a call when note is faxed.  Cell is [phone]

## 2013-09-14 NOTE — Telephone Encounter (Signed)
Pt notified that I am faxing note now.

## 2013-09-27 ENCOUNTER — Other Ambulatory Visit: Payer: Self-pay

## 2013-09-27 ENCOUNTER — Ambulatory Visit (INDEPENDENT_AMBULATORY_CARE_PROVIDER_SITE_OTHER): Payer: 59 | Admitting: General Surgery

## 2013-09-27 ENCOUNTER — Encounter (INDEPENDENT_AMBULATORY_CARE_PROVIDER_SITE_OTHER): Payer: Self-pay | Admitting: General Surgery

## 2013-09-27 VITALS — BP 136/68 | HR 84 | Temp 98.7°F | Resp 16 | Ht 67.5 in | Wt 320.4 lb

## 2013-09-27 DIAGNOSIS — Z9889 Other specified postprocedural states: Secondary | ICD-10-CM

## 2013-09-27 NOTE — Patient Instructions (Signed)
Return to the office as needed 

## 2013-09-27 NOTE — Progress Notes (Signed)
Jenna Winters is a 37 y.o. female who is here for a follow up visit regarding her hemorrhoid surgery.  She is feeling better.  She is having regular BM's.    Objective: Filed Vitals:   09/27/13 1539  BP: 136/68  Pulse: 84  Temp: 98.7 F (37.1 C)  Resp: 16    General appearance: alert and cooperative GI: normal findings: soft, non-tender DRE: no stenosis  Assessment and Plan: Doing well. RTO PRN    .Vanita Panda, MD [facility], [place] [phone]

## 2013-10-01 ENCOUNTER — Encounter: Payer: Self-pay | Admitting: Internal Medicine

## 2013-10-01 ENCOUNTER — Ambulatory Visit (INDEPENDENT_AMBULATORY_CARE_PROVIDER_SITE_OTHER): Payer: 59 | Admitting: Internal Medicine

## 2013-10-01 VITALS — BP 110/80 | HR 85 | Temp 98.5°F | Ht 67.5 in | Wt 320.4 lb

## 2013-10-01 DIAGNOSIS — R7309 Other abnormal glucose: Secondary | ICD-10-CM

## 2013-10-01 DIAGNOSIS — E039 Hypothyroidism, unspecified: Secondary | ICD-10-CM

## 2013-10-01 DIAGNOSIS — K644 Residual hemorrhoidal skin tags: Secondary | ICD-10-CM

## 2013-10-01 DIAGNOSIS — Z23 Encounter for immunization: Secondary | ICD-10-CM

## 2013-10-01 DIAGNOSIS — I10 Essential (primary) hypertension: Secondary | ICD-10-CM

## 2013-10-01 DIAGNOSIS — E7800 Pure hypercholesterolemia, unspecified: Secondary | ICD-10-CM

## 2013-10-01 DIAGNOSIS — R739 Hyperglycemia, unspecified: Secondary | ICD-10-CM

## 2013-10-01 MED ORDER — METOPROLOL SUCCINATE ER 50 MG PO TB24: 50.0000 mg | ORAL_TABLET | Freq: Two times a day (BID) | ORAL | Status: DC

## 2013-10-01 MED ORDER — SERTRALINE HCL 50 MG PO TABS: 50.0000 mg | ORAL_TABLET | Freq: Every day | ORAL | Status: DC

## 2013-10-01 MED ORDER — LISINOPRIL-HYDROCHLOROTHIAZIDE 20-25 MG PO TABS: 1.0000 | ORAL_TABLET | Freq: Every day | ORAL | Status: DC

## 2013-10-01 NOTE — Progress Notes (Signed)
Pre-visit discussion using our clinic review tool. No additional management support is needed unless otherwise documented below in the visit note.  

## 2013-10-02 ENCOUNTER — Encounter: Payer: Self-pay | Admitting: Internal Medicine

## 2013-10-02 NOTE — Assessment & Plan Note (Signed)
Low carb diet, exercise and weight loss.   Follow.  Recheck fasting glucose and a1c.

## 2013-10-02 NOTE — Assessment & Plan Note (Signed)
S/p hemorrhoid surgery.  Doing well.

## 2013-10-02 NOTE — Assessment & Plan Note (Signed)
Blood pressure as outlined.  Continue same med regimen. Follow.   

## 2013-10-02 NOTE — Assessment & Plan Note (Signed)
Had a history of hyperthyroidism and is s/p ablation.  Now hypothyroid.  Continue thyroid replacement.  Follow tsh.  TSH just checked 09/25/13 - wnl.

## 2013-10-02 NOTE — Progress Notes (Signed)
  Subjective:    Patient ID: Jenna Winters, female    DOB: March 09, 1976, 37 y.o.   MRN: [id number]  HPI 37 year old female with past history of hypertension, hypercholesterolemia and hyperthyroidism s/p ablation with resulting hypothyroidism.  She comes in today for a scheduled follow up.  She was experiencing Increased stress at work.  Was started back on zoloft.   Doing better.  No headache.  Breathing stable.  Is s/p hysterectomy.  Has done well.  Bowels doing better.  Had recent hemorrhoid surgery by [facility].  Dong well now.  Overall she feels she is doing well.  Taking melatonin to help her sleep.  No longer taking the ambien.  Discussed the importance of diet and exercise.     Past Medical History  Diagnosis Date  . Hypertension   . Hyperthyroidism 2009    s/p ablation with resulting hypothyroidism  . Depression     Current Outpatient Prescriptions on File Prior to Visit  Medication Sig Dispense Refill  . hydrocortisone (ANUSOL-HC) 2.5 % rectal cream Place rectally 2 (two) times daily as needed for hemorrhoids.  30 g  0  . levothyroxine (SYNTHROID, LEVOTHROID) 175 MCG tablet Take 1 tablet (175 mcg total) by mouth daily before breakfast.  90 tablet  1  . zolpidem (AMBIEN) 5 MG tablet Take 1 tablet (5 mg total) by mouth at bedtime as needed for sleep.  15 tablet  0   No current facility-administered medications on file prior to visit.    Review of Systems Patient denies any headache, lightheadedness or dizziness.  No sinus or allergy symptoms.  No chest pain, tightness or palpitations.  No increased shortness of breath, cough or congestion.  No acid reflux.  No nausea or vomiting.  No abdominal pain or cramping.  No bowel change, such as diarrhea, constipation, BRBPR or melana.   Bowels doing better.  Recovering well from the hemorrhoid surgery.  No urine change.  Increased stress.  Doing better back on the Zoloft.  Sleeping better with melatonin.  Not using ambien.         Objective:   Physical Exam  Filed Vitals:   10/01/13 0954  BP: 110/80  Pulse: 85  Temp: 98.5 F (36.9 C)   Blood pressure recheck:  118-81/10-53  37 year old female in no acute distress.   HEENT:  Nares- clear.  Oropharynx - without lesions. NECK:  Supple.  Nontender.  No audible bruit.  HEART:  Appears to be regular. LUNGS:  No crackles or wheezing audible.  Respirations even and unlabored.  RADIAL PULSE:  Equal bilaterally.   ABDOMEN:  Soft, nontender.  Bowel sounds present and normal.  No audible abdominal bruit.   EXTREMITIES:  No increased edema present.  DP pulses palpable and equal bilaterally.           Assessment & Plan:  GYN.  Is s/p hysterectomy.  Doing well.  Follow.   INCREASED PSYCHOSOCIAL STRESSORS.  Back on zoloft and doing well.  Using melatonin to help her sleep.  Not using ambien.      HEALTH MAINTENANCE.  GYN exams through Dr Luella Cook.

## 2013-10-02 NOTE — Assessment & Plan Note (Signed)
Low cholesterol diet and exercise.  Follow lipid panel.   

## 2013-10-11 ENCOUNTER — Encounter: Payer: Self-pay | Admitting: Internal Medicine

## 2013-11-22 HISTORY — PX: BREAST BIOPSY: SHX20

## 2014-01-23 ENCOUNTER — Telehealth: Payer: Self-pay | Admitting: Internal Medicine

## 2014-01-23 MED ORDER — LEVOTHYROXINE SODIUM 175 MCG PO TABS: 175.0000 ug | ORAL_TABLET | Freq: Every day | ORAL | Status: DC

## 2014-01-23 NOTE — Telephone Encounter (Signed)
levothyroxine (SYNTHROID, LEVOTHROID) 175 MCG tablet #30  [facility]  90# day sent to [facility]

## 2014-01-23 NOTE — Telephone Encounter (Signed)
Rx sent to pharmacy by escript  

## 2014-01-30 ENCOUNTER — Encounter: Payer: Self-pay | Admitting: Internal Medicine

## 2014-01-30 ENCOUNTER — Ambulatory Visit (INDEPENDENT_AMBULATORY_CARE_PROVIDER_SITE_OTHER): Payer: 59 | Admitting: Internal Medicine

## 2014-01-30 VITALS — BP 100/80 | HR 83 | Temp 98.4°F | Wt 340.8 lb

## 2014-01-30 DIAGNOSIS — G479 Sleep disorder, unspecified: Secondary | ICD-10-CM

## 2014-01-30 DIAGNOSIS — R7309 Other abnormal glucose: Secondary | ICD-10-CM

## 2014-01-30 DIAGNOSIS — E039 Hypothyroidism, unspecified: Secondary | ICD-10-CM

## 2014-01-30 DIAGNOSIS — I10 Essential (primary) hypertension: Secondary | ICD-10-CM

## 2014-01-30 DIAGNOSIS — R739 Hyperglycemia, unspecified: Secondary | ICD-10-CM

## 2014-01-30 DIAGNOSIS — E7800 Pure hypercholesterolemia, unspecified: Secondary | ICD-10-CM

## 2014-01-30 MED ORDER — SERTRALINE HCL 50 MG PO TABS: 50.0000 mg | ORAL_TABLET | Freq: Every day | ORAL | Status: DC

## 2014-01-30 MED ORDER — TRAZODONE HCL 50 MG PO TABS: 25.0000 mg | ORAL_TABLET | Freq: Every evening | ORAL | Status: DC | PRN

## 2014-01-30 NOTE — Progress Notes (Signed)
  Subjective:    Patient ID: Jenna Winters, female    DOB: 1976-06-26, 38 y.o.   MRN: [id number]  HPI 38 year old female with past history of hypertension, hypercholesterolemia and hyperthyroidism s/p ablation with resulting hypothyroidism.  She comes in today for a scheduled follow up.  She was experiencing Increased stress at work.  Was started back on zoloft.   Doing better.  No headache.  Breathing stable.  Is s/p hysterectomy.  Has done well.  Bowels doing better.  Had recent hemorrhoid surgery by [facility].  Dong well now.  Overall she feels she is doing well.  Taking melatonin to help her sleep.  No longer taking the ambien.  Feels she needs something more to help he rsleep.  Discussed treatment options.  Discussed the importance of diet and exercise.     Past Medical History  Diagnosis Date  . Hypertension   . Hyperthyroidism 2009    s/p ablation with resulting hypothyroidism  . Depression     Current Outpatient Prescriptions on File Prior to Visit  Medication Sig Dispense Refill  . levothyroxine (SYNTHROID, LEVOTHROID) 175 MCG tablet Take 1 tablet (175 mcg total) by mouth daily before breakfast.  30 tablet  1  . lisinopril-hydrochlorothiazide (PRINZIDE,ZESTORETIC) 20-25 MG per tablet Take 1 tablet by mouth daily.  90 tablet  3  . metoprolol succinate (TOPROL-XL) 50 MG 24 hr tablet Take 1 tablet (50 mg total) by mouth 2 (two) times daily. Take with or immediately following a meal.  180 tablet  3  . sertraline (ZOLOFT) 50 MG tablet Take 1 tablet (50 mg total) by mouth daily.  90 tablet  1   No current facility-administered medications on file prior to visit.    Review of Systems Patient denies any headache, lightheadedness or dizziness.  No sinus or allergy symptoms.  No chest pain, tightness or palpitations.  No increased shortness of breath, cough or congestion.  No acid reflux.  No nausea or vomiting.  No abdominal pain or cramping.  No bowel change, such as  diarrhea, constipation, BRBPR or melana.   Bowels doing better. Doing well s/p hemorrhoid surgery.  No urine change.  Increased stress.  Doing better back on the Zoloft.  Still with sleeping issues.         Objective:   Physical Exam  Filed Vitals:   01/30/14 0902  BP: 100/80  Pulse: 83  Temp: 98.4 F (36.9 C)   Blood pressure recheck:  84/38  38 year old female in no acute distress.   HEENT:  Nares- clear.  Oropharynx - without lesions. NECK:  Supple.  Nontender.  No audible bruit.  HEART:  Appears to be regular. LUNGS:  No crackles or wheezing audible.  Respirations even and unlabored.  RADIAL PULSE:  Equal bilaterally.   ABDOMEN:  Soft, nontender.  Bowel sounds present and normal.  No audible abdominal bruit.   EXTREMITIES:  No increased edema present.  DP pulses palpable and equal bilaterally.           Assessment & Plan:  GYN.  Is s/p hysterectomy.  Doing well.  Follow.   INCREASED PSYCHOSOCIAL STRESSORS.  Back on zoloft and doing better.  Still with sleeping issues.  Trial of trazodone as outlined.       HEALTH MAINTENANCE.  GYN exams through Dr Laurey Morale.

## 2014-01-30 NOTE — Progress Notes (Signed)
Pre-visit discussion using our clinic review tool. No additional management support is needed unless otherwise documented below in the visit note.  

## 2014-01-30 NOTE — Assessment & Plan Note (Addendum)
Blood pressure as outlined.  Continue same med regimen. Follow.   

## 2014-01-30 NOTE — Assessment & Plan Note (Addendum)
Had a history of hyperthyroidism and is s/p ablation.  Now hypothyroid.  Continue thyroid replacement.  Follow tsh.  TSH last checked 09/25/13 - wnl.

## 2014-01-30 NOTE — Assessment & Plan Note (Addendum)
Low cholesterol diet and exercise.  Follow lipid panel.   

## 2014-01-30 NOTE — Assessment & Plan Note (Addendum)
Low carb diet, exercise and weight loss.   Follow.  Follow fasting glucose and a1c.   

## 2014-02-03 ENCOUNTER — Encounter: Payer: Self-pay | Admitting: Internal Medicine

## 2014-02-03 DIAGNOSIS — G479 Sleep disorder, unspecified: Secondary | ICD-10-CM | POA: Insufficient documentation

## 2014-02-03 NOTE — Assessment & Plan Note (Signed)
With persistent problems with not being able to sleep.  Has tried melatonin.  Start trazodone as directed.  Follow.

## 2014-05-03 ENCOUNTER — Ambulatory Visit: Payer: 59 | Admitting: Internal Medicine

## 2014-05-31 ENCOUNTER — Ambulatory Visit: Payer: 59 | Admitting: Internal Medicine

## 2014-05-31 DIAGNOSIS — Z0289 Encounter for other administrative examinations: Secondary | ICD-10-CM

## 2014-09-12 ENCOUNTER — Encounter: Payer: Self-pay | Admitting: Internal Medicine

## 2014-09-12 ENCOUNTER — Ambulatory Visit (INDEPENDENT_AMBULATORY_CARE_PROVIDER_SITE_OTHER): Payer: 59 | Admitting: Internal Medicine

## 2014-09-12 VITALS — BP 102/80 | HR 78 | Temp 98.4°F | Ht 67.5 in | Wt 342.2 lb

## 2014-09-12 DIAGNOSIS — G479 Sleep disorder, unspecified: Secondary | ICD-10-CM

## 2014-09-12 DIAGNOSIS — Z23 Encounter for immunization: Secondary | ICD-10-CM

## 2014-09-12 DIAGNOSIS — E7800 Pure hypercholesterolemia, unspecified: Secondary | ICD-10-CM

## 2014-09-12 DIAGNOSIS — E039 Hypothyroidism, unspecified: Secondary | ICD-10-CM

## 2014-09-12 DIAGNOSIS — R739 Hyperglycemia, unspecified: Secondary | ICD-10-CM

## 2014-09-12 DIAGNOSIS — I10 Essential (primary) hypertension: Secondary | ICD-10-CM

## 2014-09-12 MED ORDER — LEVOTHYROXINE SODIUM 175 MCG PO TABS: 175.0000 ug | ORAL_TABLET | Freq: Every day | ORAL | Status: DC

## 2014-09-12 MED ORDER — SERTRALINE HCL 50 MG PO TABS: 50.0000 mg | ORAL_TABLET | Freq: Every day | ORAL | Status: DC

## 2014-09-12 MED ORDER — METOPROLOL SUCCINATE ER 50 MG PO TB24: 50.0000 mg | ORAL_TABLET | Freq: Two times a day (BID) | ORAL | Status: DC

## 2014-09-12 MED ORDER — LISINOPRIL-HYDROCHLOROTHIAZIDE 20-25 MG PO TABS: 1.0000 | ORAL_TABLET | Freq: Every day | ORAL | Status: DC

## 2014-09-12 NOTE — Progress Notes (Signed)
Pre visit review using our clinic review tool, if applicable. No additional management support is needed unless otherwise documented below in the visit note. 

## 2014-09-16 NOTE — Assessment & Plan Note (Signed)
Discussed diet, exercise and weight loss.  Form completed.  Has already made diet adjustment.  Plans to get more serious about her exercise.

## 2014-09-16 NOTE — Assessment & Plan Note (Signed)
Had a history of hyperthyroidism and is s/p ablation.  Now hypothyroid.  Continue thyroid replacement.  Follow tsh.

## 2014-09-16 NOTE — Assessment & Plan Note (Signed)
She is sleeping better now since using herbal life.  Follow.

## 2014-09-16 NOTE — Assessment & Plan Note (Signed)
Low carb diet, exercise and weight loss.   Follow.  Follow fasting glucose and a1c.

## 2014-09-16 NOTE — Progress Notes (Signed)
Subjective:    Patient ID: Jenna Winters, female    DOB: [birth year], 38 y.o.   MRN: [id number]  HPI 38 year old female with past history of hypertension, hypercholesterolemia and hyperthyroidism s/p ablation with resulting hypothyroidism.  She comes in today for a scheduled follow up.  She was experiencing Increased stress at work.  Was started back on zoloft.  Doing better.  Stress is better.  No headache.  Breathing stable.  Is s/p hysterectomy.  Has done well.  Bowels doing better.  Overall she feels she is doing well.  Discussed the importance of diet and exercise.  States her left foot has been limiting her some.  She is starting back to "[employer]".  She also is doing herbal life and is cooking healthier.  Eating whole grains, etc.      Past Medical History  Diagnosis Date  . Hypertension   . Hyperthyroidism 2009    s/p ablation with resulting hypothyroidism  . Depression     Outpatient Encounter Prescriptions as of 09/12/2014  Medication Sig  . levothyroxine (SYNTHROID, LEVOTHROID) 175 MCG tablet Take 1 tablet (175 mcg total) by mouth daily before breakfast.  . lisinopril-hydrochlorothiazide (PRINZIDE,ZESTORETIC) 20-25 MG per tablet Take 1 tablet by mouth daily.  . metoprolol succinate (TOPROL-XL) 50 MG 24 hr tablet Take 1 tablet (50 mg total) by mouth 2 (two) times daily. Take with or immediately following a meal.  . sertraline (ZOLOFT) 50 MG tablet Take 1 tablet (50 mg total) by mouth daily.  . [DISCONTINUED] levothyroxine (SYNTHROID, LEVOTHROID) 175 MCG tablet Take 1 tablet (175 mcg total) by mouth daily before breakfast.  . [DISCONTINUED] lisinopril-hydrochlorothiazide (PRINZIDE,ZESTORETIC) 20-25 MG per tablet Take 1 tablet by mouth daily.  . [DISCONTINUED] metoprolol succinate (TOPROL-XL) 50 MG 24 hr tablet Take 1 tablet (50 mg total) by mouth 2 (two) times daily. Take with or immediately following a meal.  . [DISCONTINUED] sertraline (ZOLOFT) 50 MG tablet Take 1 tablet (50  mg total) by mouth daily.  . [DISCONTINUED] traZODone (DESYREL) 50 MG tablet Take 0.5-1 tablets (25-50 mg total) by mouth at bedtime as needed for sleep.    Review of Systems Patient denies any headache, lightheadedness or dizziness.  No sinus or allergy symptoms.  No chest pain, tightness or palpitations.  No increased shortness of breath, cough or congestion.  No acid reflux.  No nausea or vomiting.  No abdominal pain or cramping.  No bowel change, such as diarrhea, constipation, BRBPR or melana.   Bowels doing better. No urine change.  Increased stress.  Doing better back on the Zoloft.  Stress is better.  Has adjusted her diet.  Is plannin to get back in to exercising.         Objective:   Physical Exam  Filed Vitals:   09/12/14 1115  BP: 102/80  Pulse: 78  Temp: 98.4 F (36.9 C)   Blood pressure recheck:  54/45-82  38 year old female in no acute distress.   HEENT:  Nares- clear.  Oropharynx - without lesions. NECK:  Supple.  Nontender.  No audible bruit.  HEART:  Appears to be regular. LUNGS:  No crackles or wheezing audible.  Respirations even and unlabored.  RADIAL PULSE:  Equal bilaterally.   ABDOMEN:  Soft, nontender.  Bowel sounds present and normal.  No audible abdominal bruit.   EXTREMITIES:  No increased edema present.  DP pulses palpable and equal bilaterally.           Assessment & Plan:  GYN.  Is s/p hysterectomy.  Doing well.  Follow.   INCREASED PSYCHOSOCIAL STRESSORS.  Back on zoloft and doing better.    Problem List Items Addressed This Visit   Difficulty sleeping     She is sleeping better now since using herbal life.  Follow.       Essential hypertension, benign     Blood pressure as outlined.  Continue same med regimen. Follow.      Relevant Medications      lisinopril-hydrochlorothiazide (PRINZIDE,ZESTORETIC) 20-25 MG per tablet      metoprolol succinate (TOPROL-XL) 24 hr tablet   Hypercholesterolemia     Low cholesterol diet and exercise.   Follow lipid panel.       Relevant Medications      lisinopril-hydrochlorothiazide (PRINZIDE,ZESTORETIC) 20-25 MG per tablet      metoprolol succinate (TOPROL-XL) 24 hr tablet   Hyperglycemia     Low carb diet, exercise and weight loss.   Follow.  Follow fasting glucose and a1c.      Hypothyroidism (acquired)     Had a history of hyperthyroidism and is s/p ablation.  Now hypothyroid.  Continue thyroid replacement.  Follow tsh.       Relevant Medications      levothyroxine (SYNTHROID, LEVOTHROID) tablet      metoprolol succinate (TOPROL-XL) 24 hr tablet   Severe obesity (BMI >= 40)     Discussed diet, exercise and weight loss.  Form completed.  Has already made diet adjustment.  Plans to get more serious about her exercise.        Other Visit Diagnoses   Encounter for immunization    -  Primary          HEALTH MAINTENANCE.  GYN exams through Dr Laurey Morale.   I spent 25 minutes with the patient and more than 50% of the time was spent in consultation regarding the above.

## 2014-09-16 NOTE — Assessment & Plan Note (Signed)
Low cholesterol diet and exercise.  Follow lipid panel.   

## 2014-09-16 NOTE — Assessment & Plan Note (Signed)
Blood pressure as outlined.  Continue same med regimen. Follow.

## 2014-11-04 ENCOUNTER — Ambulatory Visit: Payer: Self-pay

## 2014-11-04 ENCOUNTER — Encounter: Payer: Self-pay | Admitting: *Deleted

## 2014-11-11 ENCOUNTER — Encounter: Payer: Self-pay | Admitting: General Surgery

## 2014-11-11 ENCOUNTER — Ambulatory Visit (INDEPENDENT_AMBULATORY_CARE_PROVIDER_SITE_OTHER): Payer: 59 | Admitting: General Surgery

## 2014-11-11 ENCOUNTER — Other Ambulatory Visit: Payer: 59

## 2014-11-11 VITALS — BP 124/78 | HR 78 | Resp 14 | Ht 67.0 in | Wt 330.0 lb

## 2014-11-11 DIAGNOSIS — N63 Unspecified lump in unspecified breast: Secondary | ICD-10-CM

## 2014-11-11 NOTE — Patient Instructions (Signed)
Patient to return in 1 week for nurse visit. The patient is aware to call back for any questions or concerns.     CARE AFTER BREAST BIOPSY  1. Leave the dressing on that your doctor applied after surgery. It is waterproof. You may bathe, shower and/or swim. The dressing will probably remain intact until your return office visit. If the dressing comes off, you will see small strips of tape against your skin on the incision. Do not remove these strips.  2. You may want to use a gauze,cloth or similar protection in your bra to prevent rubbing against your dressing and incision. This is not necessary, but you may feel more comfortable doing so.  3. It is recommended that you wear a bra day and night to give support to the breast. This will prevent the weight of the breast from pulling on the incision.  4. Your breast will feel hard and lumpy under the incision. Do not be alarmed. This is the underlying stitching of tissue. Softening of this tissue will occur in time.  5. Make sure you call the office and schedule an appointment in one week after your surgery. The office phone number is [phone]. The nurses at [facility] may have already done this for you.  6. You will notice about a week after your office visit that the strips of the tape on your incision will begin to loosen. These may then be removed.  7. Report to your doctor any of the following:  * Severe pain not relieved by your pain medication  *Redness of the incision  * Drainage from the incision  *Fever greater than 101 degrees

## 2014-11-11 NOTE — Progress Notes (Signed)
Patient ID: Jenna Winters, female   DOB: 1975/12/10, 38 y.o.   MRN: [id number]  Chief Complaint  Patient presents with  . Other    breast evaluation    HPI Jenna Winters is a 38 y.o. female who presents for a breast evaluation. The most recent mammogram was done on 11/04/14. Patient does perform regular self breast checks. This was her first mammogram done.  She denies any problems with the breasts. No personal or family history of breast problems.    HPI  Past Medical History  Diagnosis Date  . Hypertension   . Hyperthyroidism 2009    s/p ablation with resulting hypothyroidism  . Depression   . Hemorrhoid     Past Surgical History  Procedure Laterality Date  . Abdominal hysterectomy  08/2011  . Dilation and curettage of uterus  12/2010  . Dilation and curettage of uterus  1995  . Colonoscopy    . Hemorrhoid surgery  08/2013    Family History  Problem Relation Age of Onset  . Hyperlipidemia Father   . Hypertension Father   . Diabetes Father   . Hyperlipidemia Paternal Grandmother   . Hypertension Paternal Grandmother   . Diabetes Paternal Grandmother   . Hyperlipidemia Paternal Grandfather   . Hypertension Paternal Grandfather   . Diabetes Paternal Grandfather   . Lung cancer Paternal Grandfather   . COPD Paternal Grandfather     lung    Social History History  Substance Use Topics  . Smoking status: Never Smoker   . Smokeless tobacco: Never Used  . Alcohol Use: 0.0 oz/week    0 Not specified per week     Comment: rare occasion    No Known Allergies  Current Outpatient Prescriptions  Medication Sig Dispense Refill  . levothyroxine (SYNTHROID, LEVOTHROID) 175 MCG tablet Take 1 tablet (175 mcg total) by mouth daily before breakfast. 90 tablet 3  . lisinopril-hydrochlorothiazide (PRINZIDE,ZESTORETIC) 20-25 MG per tablet Take 1 tablet by mouth daily. 90 tablet 3  . metoprolol succinate (TOPROL-XL) 50 MG 24 hr tablet Take 1 tablet (50 mg total) by mouth  2 (two) times daily. Take with or immediately following a meal. 180 tablet 3  . sertraline (ZOLOFT) 50 MG tablet Take 1 tablet (50 mg total) by mouth daily. 90 tablet 1   No current facility-administered medications for this visit.    Review of Systems Review of Systems  Constitutional: Negative.   Respiratory: Negative.   Cardiovascular: Negative.     Blood pressure 124/78, pulse 78, resp. rate 14, height 5\' 7"  (1.702 m), weight 330 lb (149.687 kg).  Physical Exam Physical Exam  Constitutional: She is oriented to person, place, and time. She appears well-developed and well-nourished.  Neck: Neck supple. No thyromegaly present.  Cardiovascular: Normal rate, regular rhythm and normal heart sounds.   No murmur heard. Pulmonary/Chest: Effort normal and breath sounds normal. Right breast exhibits no inverted nipple, no mass, no nipple discharge, no skin change and no tenderness. Left breast exhibits no inverted nipple, no mass, no nipple discharge, no skin change and no tenderness.  Lymphadenopathy:    She has no cervical adenopathy.    She has no axillary adenopathy.  Neurological: She is alert and oriented to person, place, and time.  Skin: Skin is warm and dry.    Data Reviewed Screening man dated 10/15/2014 showed a well-defined density in the upper outer aspect of the left breast measuring 1.5 cm. Ultrasound was recommended. BI-RADS-0.  Focal spot compression views  and ultrasound dated 11/04/2014 showed a lobulated 1.5 cm mass on mammography. Ultrasound showed a 0.6 x 1.3 x 1.0 cm mass in the 2:00 position 4 cm the nipple. BI-RADS-4. Guided Biopsy Was Recommended.  Ultrasound Examination of the Left Breast in the 3:00 Position 5 Cm the Nipple Showed a Well-Defined Hypoechoic Area Measuring 0.2 x 0.3 x 0.34 centimeters. No acoustic enhancement or shadowing. Indeterminate.  At the 1:00 position of the left breast, 5 cm in the nipple a slightly lobulated 0.8 x 1.0 x 1.16 cm  hypoechoic mass with posterior acoustic enhancement was noted just 1 cm below the skin. This corresponded to the images obtained at [facility] reporting the lesion of the 2:00 position.  The patient desired biopsy.  10 mL of 0.5% Xylocaine with 0.25% Marcaine with 1-200,000 of epinephrine was utilized well tolerated.  A 14-gauge biopsy device was used proximally 6 core samples obtained. Primarily fatty-appearing tissue was noted. A postbiopsy clip was placed. Bleeding was noted. Skin defect was closed with benzoin and Steri-Strips followed by Telfa and Tegaderm dressing.  Written instructions were provided for postoperative wound care.   Assessment    Left breast mass on screening mammogram.    Plan    The patient will be contacted when biopsy results are available.    PCP:  Einar Pheasant Ref MD; Jaclyn Shaggy Gutierrez/Dr. Margette Fast, [place] 11/11/2014, 9:47 PM

## 2014-11-12 ENCOUNTER — Telehealth: Payer: Self-pay | Admitting: *Deleted

## 2014-11-12 LAB — PATHOLOGY

## 2014-11-12 NOTE — Telephone Encounter (Signed)
Notified patient as instructed, patient pleased. Discussed follow-up appointments, patient agrees  

## 2014-11-18 ENCOUNTER — Ambulatory Visit (INDEPENDENT_AMBULATORY_CARE_PROVIDER_SITE_OTHER): Payer: Self-pay | Admitting: *Deleted

## 2014-11-18 DIAGNOSIS — N63 Unspecified lump in unspecified breast: Secondary | ICD-10-CM

## 2014-11-18 NOTE — Progress Notes (Signed)
Patient here today for follow up post  Right breast biopsy. Steristrip in place and aware it may come off in one week.  Minimal bruising noted.  The patient is aware that a heating pad may be used for comfort as needed.  Aware of pathology. Follow up as scheduled.

## 2015-01-16 ENCOUNTER — Ambulatory Visit: Payer: 59 | Admitting: Internal Medicine

## 2015-04-25 ENCOUNTER — Encounter: Payer: Self-pay | Admitting: Internal Medicine

## 2015-04-25 ENCOUNTER — Ambulatory Visit (INDEPENDENT_AMBULATORY_CARE_PROVIDER_SITE_OTHER): Payer: 59 | Admitting: Internal Medicine

## 2015-04-25 VITALS — BP 108/74 | HR 80 | Temp 98.4°F | Ht 67.0 in | Wt 342.1 lb

## 2015-04-25 DIAGNOSIS — G479 Sleep disorder, unspecified: Secondary | ICD-10-CM | POA: Diagnosis not present

## 2015-04-25 DIAGNOSIS — R5383 Other fatigue: Secondary | ICD-10-CM

## 2015-04-25 DIAGNOSIS — I10 Essential (primary) hypertension: Secondary | ICD-10-CM | POA: Diagnosis not present

## 2015-04-25 DIAGNOSIS — E7800 Pure hypercholesterolemia, unspecified: Secondary | ICD-10-CM

## 2015-04-25 DIAGNOSIS — E039 Hypothyroidism, unspecified: Secondary | ICD-10-CM | POA: Diagnosis not present

## 2015-04-25 DIAGNOSIS — R739 Hyperglycemia, unspecified: Secondary | ICD-10-CM

## 2015-04-25 MED ORDER — SERTRALINE HCL 50 MG PO TABS: 50.0000 mg | ORAL_TABLET | Freq: Every day | ORAL | Status: DC
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
## 2015-04-25 NOTE — Progress Notes (Signed)
Pre visit review using our clinic review tool, if applicable. No additional management support is needed unless otherwise documented below in the visit note. 

## 2015-04-27 ENCOUNTER — Encounter: Payer: Self-pay | Admitting: Internal Medicine

## 2015-04-27 DIAGNOSIS — R5383 Other fatigue: Secondary | ICD-10-CM | POA: Insufficient documentation

## 2015-04-27 NOTE — Assessment & Plan Note (Signed)
Discussed diet and exercise.  Plans to get back to exercising and diet adjustment.

## 2015-04-27 NOTE — Assessment & Plan Note (Signed)
On thyroid replacement.  Check tsh.  

## 2015-04-27 NOTE — Assessment & Plan Note (Signed)
Low carb diet and exercise.  Follow sugar.  

## 2015-04-27 NOTE — Assessment & Plan Note (Signed)
Blood pressure under good control.  Same medication regimen.  Follow metabolic panel.   

## 2015-04-27 NOTE — Assessment & Plan Note (Signed)
Is better.  Taking melatonin prn.

## 2015-04-27 NOTE — Assessment & Plan Note (Signed)
Low cholesterol diet and exercise.  Follow lipid panel.   

## 2015-04-27 NOTE — Assessment & Plan Note (Signed)
Check met c, tsh, cbc, B12 and vitamin D level.  Start exercising.  Diet adjustment.   

## 2015-05-28 LAB — HEPATIC FUNCTION PANEL
ALT: 17 U/L (ref 7–35)
AST: 15 U/L (ref 13–35)
Alkaline Phosphatase: 95 U/L (ref 25–125)
Bilirubin, Total: 0.4 mg/dL

## 2015-05-28 LAB — TSH: TSH: 2.93 u[IU]/mL (ref 0.41–5.90)

## 2015-05-28 LAB — CBC AND DIFFERENTIAL
HCT: 40 % (ref 36–46)
HEMOGLOBIN: 13.3 g/dL (ref 12.0–16.0)
NEUTROS ABS: 4 /uL
PLATELETS: 259 10*3/uL (ref 150–399)
WBC: 7.4 10^3/mL

## 2015-05-28 LAB — BASIC METABOLIC PANEL
BUN: 15 mg/dL (ref 4–21)
Creatinine: 0.8 mg/dL (ref 0.5–1.1)
Glucose: 123 mg/dL
POTASSIUM: 4.7 mmol/L (ref 3.4–5.3)
Sodium: 137 mmol/L (ref 137–147)

## 2015-05-28 LAB — LIPID PANEL
Cholesterol: 195 mg/dL (ref 0–200)
HDL: 34 mg/dL — AB (ref 35–70)
LDL CALC: 137 mg/dL
TRIGLYCERIDES: 121 mg/dL (ref 40–160)

## 2015-05-29 ENCOUNTER — Encounter: Payer: Self-pay | Admitting: Internal Medicine

## 2015-05-30 ENCOUNTER — Telehealth: Payer: Self-pay | Admitting: Internal Medicine

## 2015-05-30 NOTE — Telephone Encounter (Signed)
Pt notified of lab results via my chart.  Start vitamin D3 2000 units per day.  Low cholesterol diet and exercise.

## 2015-06-02 NOTE — Telephone Encounter (Signed)
Unread mychart message mailed to patient 

## 2015-06-10 ENCOUNTER — Encounter: Payer: Self-pay | Admitting: Internal Medicine

## 2015-08-28 ENCOUNTER — Encounter: Payer: Self-pay | Admitting: Internal Medicine

## 2015-08-28 ENCOUNTER — Ambulatory Visit (INDEPENDENT_AMBULATORY_CARE_PROVIDER_SITE_OTHER): Payer: 59 | Admitting: Internal Medicine

## 2015-08-28 VITALS — BP 110/70 | HR 107 | Temp 98.5°F | Resp 18 | Ht 67.0 in | Wt 350.5 lb

## 2015-08-28 DIAGNOSIS — N63 Unspecified lump in unspecified breast: Secondary | ICD-10-CM

## 2015-08-28 DIAGNOSIS — Z23 Encounter for immunization: Secondary | ICD-10-CM

## 2015-08-28 DIAGNOSIS — I10 Essential (primary) hypertension: Secondary | ICD-10-CM | POA: Diagnosis not present

## 2015-08-28 DIAGNOSIS — E7800 Pure hypercholesterolemia, unspecified: Secondary | ICD-10-CM

## 2015-08-28 DIAGNOSIS — R739 Hyperglycemia, unspecified: Secondary | ICD-10-CM

## 2015-08-28 DIAGNOSIS — E039 Hypothyroidism, unspecified: Secondary | ICD-10-CM | POA: Diagnosis not present

## 2015-08-28 MED ORDER — SERTRALINE HCL 50 MG PO TABS
50.0000 mg | ORAL_TABLET | Freq: Every day | ORAL | Status: DC
Start: 2015-08-28 — End: 2015-12-29

## 2015-08-28 MED ORDER — LEVOTHYROXINE SODIUM 175 MCG PO TABS: 175.0000 ug | ORAL_TABLET | Freq: Every day | ORAL | Status: DC

## 2015-08-28 MED ORDER — LISINOPRIL-HYDROCHLOROTHIAZIDE 20-25 MG PO TABS: 1.0000 | ORAL_TABLET | Freq: Every day | ORAL | Status: DC

## 2015-08-28 MED ORDER — METOPROLOL SUCCINATE ER 50 MG PO TB24: 50.0000 mg | ORAL_TABLET | Freq: Two times a day (BID) | ORAL | Status: DC

## 2015-08-28 NOTE — Progress Notes (Signed)
Patient ID: Jenna Winters, female   DOB: 1976/06/17, 39 y.o.   MRN: [id number]   Subjective:    Patient ID: Jenna Winters, female    DOB: [birth year], 39 y.o.   MRN: [id number]  HPI  Patient with past history of hypertension, hypothyroidism, hyperglycemia and hypercholesterolemia who comes in today to follow up on these issues.  She reports she is doing relatively well.  Discussed the need for diet and exercise.  Discussed the need for weight loss.  No chest pain or tightness.  No sob.  No abdominal pain or cramping.  Bowels stable.     Past Medical History  Diagnosis Date  . Hypertension   . Hyperthyroidism 2009    s/p ablation with resulting hypothyroidism  . Depression   . Hemorrhoid    Past Surgical History  Procedure Laterality Date  . Abdominal hysterectomy  08/2011  . Dilation and curettage of uterus  12/2010  . Dilation and curettage of uterus  1995  . Colonoscopy    . Hemorrhoid surgery  08/2013   Family History  Problem Relation Age of Onset  . Hyperlipidemia Father   . Hypertension Father   . Diabetes Father   . Hyperlipidemia Paternal Grandmother   . Hypertension Paternal Grandmother   . Diabetes Paternal Grandmother   . Hyperlipidemia Paternal Grandfather   . Hypertension Paternal Grandfather   . Diabetes Paternal Grandfather   . Lung cancer Paternal Grandfather   . COPD Paternal Grandfather     lung   Social History   Social History  . Marital Status: Divorced    Spouse Name: N/A  . Number of Children: 1  . Years of Education: N/A   Occupational History  . [place] History Main Topics  . Smoking status: Never Smoker   . Smokeless tobacco: Never Used  . Alcohol Use: 0.0 oz/week    0 Standard drinks or equivalent per week     Comment: rare occasion  . Drug Use: No  . Sexual Activity: Not Asked   Other Topics Concern  . None   Social History Narrative    Outpatient Encounter Prescriptions as of 08/28/2015    Medication Sig  . cholecalciferol (VITAMIN D) 1000 UNITS tablet Take 1,000 Units by mouth daily.  Marland Kitchen levothyroxine (SYNTHROID, LEVOTHROID) 175 MCG tablet Take 1 tablet (175 mcg total) by mouth daily before breakfast.  . lisinopril-hydrochlorothiazide (PRINZIDE,ZESTORETIC) 20-25 MG tablet Take 1 tablet by mouth daily.  . metoprolol succinate (TOPROL-XL) 50 MG 24 hr tablet Take 1 tablet (50 mg total) by mouth 2 (two) times daily. Take with or immediately following a meal.  . sertraline (ZOLOFT) 50 MG tablet Take 1 tablet (50 mg total) by mouth daily.  . [DISCONTINUED] levothyroxine (SYNTHROID, LEVOTHROID) 175 MCG tablet Take 1 tablet (175 mcg total) by mouth daily before breakfast.  . [DISCONTINUED] lisinopril-hydrochlorothiazide (PRINZIDE,ZESTORETIC) 20-25 MG per tablet Take 1 tablet by mouth daily.  . [DISCONTINUED] metoprolol succinate (TOPROL-XL) 50 MG 24 hr tablet Take 1 tablet (50 mg total) by mouth 2 (two) times daily. Take with or immediately following a meal.  . [DISCONTINUED] sertraline (ZOLOFT) 50 MG tablet Take 1 tablet (50 mg total) by mouth daily.   No facility-administered encounter medications on file as of 08/28/2015.    Review of Systems  Constitutional: Negative for appetite change and unexpected weight change.  HENT: Negative for congestion and sinus pressure.   Respiratory: Negative for cough, chest tightness and shortness of breath.  Cardiovascular: Negative for chest pain, palpitations and leg swelling.  Gastrointestinal: Negative for nausea, vomiting, abdominal pain and diarrhea.  Genitourinary: Negative for dysuria and difficulty urinating.  Musculoskeletal: Negative for back pain and joint swelling.  Skin: Negative for color change and rash.  Neurological: Negative for dizziness, light-headedness and headaches.  Psychiatric/Behavioral: Negative for dysphoric mood and agitation.       Objective:    Physical Exam  Constitutional: She appears well-developed and  well-nourished. No distress.  HENT:  Nose: Nose normal.  Mouth/Throat: Oropharynx is clear and moist.  Eyes: Conjunctivae are normal. Right eye exhibits no discharge. Left eye exhibits no discharge.  Neck: Neck supple. No thyromegaly present.  Cardiovascular: Normal rate and regular rhythm.   Pulmonary/Chest: Breath sounds normal. No respiratory distress. She has no wheezes.  Abdominal: Soft. Bowel sounds are normal. There is no tenderness.  Musculoskeletal: She exhibits no tenderness.  No increased edema.    Lymphadenopathy:    She has no cervical adenopathy.  Skin: No rash noted. No erythema.  Psychiatric: She has a normal mood and affect. Her behavior is normal.    BP 110/70 mmHg  Pulse 107  Temp(Src) 98.5 F (36.9 C) (Oral)  Resp 18  Ht _0  (1.702 m)  Wt 350 lb 8 oz (158.986 kg)  BMI 54.88 kg/m2  SpO2 97% Wt Readings from Last 3 Encounters:  08/28/15 350 lb 8 oz (158.986 kg)  04/25/15 342 lb 2 oz (155.187 kg)  11/11/14 330 lb (149.687 kg)     Lab Results  Component Value Date   WBC 7.4 05/28/2015   HGB 13.3 05/28/2015   HCT 40 05/28/2015   PLT 259 05/28/2015   GLUCOSE 113* 05/01/2013   CHOL 195 05/28/2015   TRIG 121 05/28/2015   HDL 34* 05/28/2015   LDLCALC 137 05/28/2015   ALT 17 05/28/2015   AST 15 05/28/2015   NA 137 05/28/2015   K 4.7 05/28/2015   CL 99 05/01/2013   CREATININE 0.8 05/28/2015   BUN 15 05/28/2015   CO2 23 05/01/2013   TSH 2.93 05/28/2015       Assessment & Plan:   Problem List Items Addressed This Visit    Breast mass    Saw Dr Bary Castilla.  S/p biopsy.  Mammograms are performed through gyn.        Essential hypertension, benign    Blood pressure under good control.  Continue same medication regimen.  Follow pressures.  Follow metabolic panel.        Relevant Medications   metoprolol succinate (TOPROL-XL) 50 MG 24 hr tablet   lisinopril-hydrochlorothiazide (PRINZIDE,ZESTORETIC) 20-25 MG tablet   Hypercholesterolemia    Low  cholesterol diet and exercise.  Follow lipid panel.        Relevant Medications   metoprolol succinate (TOPROL-XL) 50 MG 24 hr tablet   lisinopril-hydrochlorothiazide (PRINZIDE,ZESTORETIC) 20-25 MG tablet   Hyperglycemia    Low carb diet and exercise.  Follow met b and a1c.  Discussed diet and exercise.        Hypothyroidism (acquired)    On thyroid replacement.  Follow tsh.        Relevant Medications   metoprolol succinate (TOPROL-XL) 50 MG 24 hr tablet   levothyroxine (SYNTHROID, LEVOTHROID) 175 MCG tablet   Severe obesity (BMI >= 40) (HCC)    Discussed diet and exercise.  Follow.        Other Visit Diagnoses    Encounter for immunization    -  Primary  Einar Pheasant, MD

## 2015-08-28 NOTE — Progress Notes (Signed)
Pre-visit discussion using our clinic review tool. No additional management support is needed unless otherwise documented below in the visit note.  

## 2015-08-28 NOTE — Patient Instructions (Signed)

## 2015-08-31 ENCOUNTER — Encounter: Payer: Self-pay | Admitting: Internal Medicine

## 2015-08-31 ENCOUNTER — Telehealth: Payer: Self-pay | Admitting: Internal Medicine

## 2015-08-31 NOTE — Assessment & Plan Note (Signed)
Saw Dr Bary Castilla.  S/p biopsy.  Mammograms are performed through gyn.

## 2015-08-31 NOTE — Assessment & Plan Note (Signed)
Blood pressure under good control.  Continue same medication regimen.  Follow pressures.  Follow metabolic panel.   

## 2015-08-31 NOTE — Assessment & Plan Note (Signed)
Low carb diet and exercise.  Follow met b and a1c.  Discussed diet and exercise.   

## 2015-08-31 NOTE — Assessment & Plan Note (Signed)
On thyroid replacement.  Follow tsh.  

## 2015-08-31 NOTE — Assessment & Plan Note (Signed)
Low cholesterol diet and exercise.  Follow lipid panel.   

## 2015-08-31 NOTE — Telephone Encounter (Signed)
My chart message sent to pt to inquire about scheduling mammogram.  Has been getting through [facility].

## 2015-08-31 NOTE — Assessment & Plan Note (Signed)
Discussed diet and exercise.  Follow.  

## 2015-09-02 NOTE — Telephone Encounter (Signed)
Unread mychart message mailed to patient 

## 2015-09-13 ENCOUNTER — Other Ambulatory Visit: Payer: Self-pay | Admitting: Internal Medicine

## 2015-12-29 ENCOUNTER — Other Ambulatory Visit: Payer: Self-pay | Admitting: Internal Medicine

## 2015-12-30 NOTE — Telephone Encounter (Signed)
ok'd refill for zoloft #90 with no refills.

## 2015-12-30 NOTE — Telephone Encounter (Signed)
last OV 10/16 ok to fill?

## 2016-01-02 ENCOUNTER — Ambulatory Visit: Payer: 59 | Admitting: Internal Medicine

## 2016-03-22 ENCOUNTER — Other Ambulatory Visit: Payer: Self-pay | Admitting: Internal Medicine

## 2016-03-22 NOTE — Telephone Encounter (Signed)
Refilled 12/2015. Last seen 08/2015 and last labs 05/2015. Please advise?

## 2016-03-23 NOTE — Telephone Encounter (Signed)
Need to schedule f/u appt.  Once scheduled, ok to refill until appt.  Needs to keep appt.

## 2016-03-24 ENCOUNTER — Other Ambulatory Visit: Payer: Self-pay

## 2016-03-24 MED ORDER — SERTRALINE HCL 50 MG PO TABS: ORAL_TABLET | ORAL | Status: DC

## 2016-03-26 NOTE — Telephone Encounter (Signed)
Scheduled 06/18/16 @ 1130

## 2016-05-16 ENCOUNTER — Other Ambulatory Visit: Payer: Self-pay | Admitting: Internal Medicine

## 2016-05-18 NOTE — Telephone Encounter (Signed)
ok'd refill for zoloft #90 with no refills.  Needs to keep f/u appt.

## 2016-05-18 NOTE — Telephone Encounter (Signed)
Pt is requesting a refill, last filled on 03/24/16 #30. Last OV 08/28/15. Pt has a f/u appt on 7/28.

## 2016-06-02 ENCOUNTER — Ambulatory Visit (INDEPENDENT_AMBULATORY_CARE_PROVIDER_SITE_OTHER): Payer: 59 | Admitting: Family Medicine

## 2016-06-02 ENCOUNTER — Ambulatory Visit (INDEPENDENT_AMBULATORY_CARE_PROVIDER_SITE_OTHER): Payer: 59

## 2016-06-02 ENCOUNTER — Encounter: Payer: Self-pay | Admitting: Family Medicine

## 2016-06-02 VITALS — BP 128/86 | HR 90 | Temp 97.8°F | Ht 67.0 in | Wt 360.4 lb

## 2016-06-02 DIAGNOSIS — S99912A Unspecified injury of left ankle, initial encounter: Secondary | ICD-10-CM

## 2016-06-02 NOTE — Assessment & Plan Note (Signed)
Suspect likely left ankle sprain though with tenderness over lateral malleolus and left ankle we will obtain x-ray imaging to evaluate for fracture. Suspect peripheral nerve injury leading to the mild numbness. She is vascularly intact. Do not suspect a compartment syndrome injury at this time. We will provide her with a prescription for a cam walker to fill pending x-ray results. Ice and elevation as well. She will monitor for discussed symptoms of compartment syndrome for which she should seek medical attention immediately. Given return precautions.

## 2016-06-02 NOTE — Progress Notes (Signed)
Pre visit review using our clinic review tool, if applicable. No additional management support is needed unless otherwise documented below in the visit note. 

## 2016-06-02 NOTE — Patient Instructions (Signed)
Nice to meet you. We are going to obtain some x-rays to evaluate your ankle. We will call with results. I will give you a prescription for a cam walker to use if you would like. If you develop worsening numbness, or you develop weakness, worsening swelling, increasing pain, color change to her feet, or any new or changing symptoms please seek medical attention.

## 2016-06-02 NOTE — Progress Notes (Signed)
Patient ID: Jenna Winters, female   DOB: 11/08/1976, 41 y.o.   MRN: [id number]  Tommi Rumps, MD Phone: [phone]  Jenna Winters Porta is a 40 y.o. female who presents today for same-day visit.  Patient notes left ankle injury on Saturday morning. She is walking out the door and her left ankle inverted. Notes numbness really doing anything to cause this. She is able walk on it immediately after. Has had some bruising and swelling in her left lateral ankle and dorsal foot. Notes maybe some sensory changes overlying the area of swelling though no other sensory changes. No weakness. Occasional shooting pain though not really any pain now. Is able to walk on this with no difficulty.   ROS see history of present illness  Objective  Physical Exam Filed Vitals:   06/02/16 1305  BP: 128/86  Pulse: 90  Temp: 97.8 F (36.6 C)    BP Readings from Last 3 Encounters:  06/02/16 128/86  08/28/15 110/70  04/25/15 108/74   Wt Readings from Last 3 Encounters:  06/02/16 360 lb 6.4 oz (163.476 kg)  08/28/15 350 lb 8 oz (158.986 kg)  04/25/15 342 lb 2 oz (155.187 kg)    Physical Exam  Constitutional: No distress.  Cardiovascular:  Bilateral feet with 2+ DP pulses, capillary refill less than 2 seconds, and bilateral feet warm and well-perfused  Musculoskeletal:  Left lateral ankle and dorsum of foot with mild swelling, mild bruising in this area as well, no tenderness over the areas of swelling, there is tenderness over the left lateral malleolus, no tenderness of 50 at a metatarsal or navicular or medial malleolus on the left, no tenderness of fibula, full range of motion, area of swelling is soft and nontender in left foot and ankle, right ankle with no tenderness, swelling, or bruising, right ankle full range of motion  Neurological: She is alert. Gait normal.  5 out of 5 strength bilateral plantar flexion, dorsiflexion, eversion, and inversion, mildly decreased sensation to light touch  overlying the left foot lateral dorsum of the foot and lateral ankle inferior to the malleolus, sensation otherwise intact  Skin: Skin is warm and dry. She is not diaphoretic.     Assessment/Plan: Please see individual problem list.  Left ankle injury Suspect likely left ankle sprain though with tenderness over lateral malleolus and left ankle we will obtain x-ray imaging to evaluate for fracture. Suspect peripheral nerve injury leading to the mild numbness. She is vascularly intact. Do not suspect a compartment syndrome injury at this time. We will provide her with a prescription for a cam walker to fill pending x-ray results. Ice and elevation as well. She will monitor for discussed symptoms of compartment syndrome for which she should seek medical attention immediately. Given return precautions.    Orders Placed This Encounter  Procedures  . DG Ankle Complete Left    Standing Status: Future     Number of Occurrences: 1     Standing Expiration Date: 08/03/2017    Order Specific Question:  Reason for Exam (SYMPTOM  OR DIAGNOSIS REQUIRED)    Answer:  left ankle inversion several days ago, lateral maleolus tenderness, mild overlying sensory change    Order Specific Question:  Is the patient pregnant?    Answer:  No    Order Specific Question:  Preferred imaging location?    Answer:  [facility]  . DG Foot Complete Left    Standing Status: Future     Number of Occurrences: 1  Standing Expiration Date: 08/03/2017    Order Specific Question:  Reason for Exam (SYMPTOM  OR DIAGNOSIS REQUIRED)    Answer:  left ankle inversion several days ago, lateral maleolus tenderness, mild overlying sensory change    Order Specific Question:  Is the patient pregnant?    Answer:  No    Order Specific Question:  Preferred imaging location?    Answer:  [facility]    Tommi Rumps, MD [place]

## 2016-06-18 ENCOUNTER — Encounter: Payer: Self-pay | Admitting: Internal Medicine

## 2016-06-18 ENCOUNTER — Ambulatory Visit (INDEPENDENT_AMBULATORY_CARE_PROVIDER_SITE_OTHER): Payer: 59 | Admitting: Internal Medicine

## 2016-06-18 DIAGNOSIS — E039 Hypothyroidism, unspecified: Secondary | ICD-10-CM

## 2016-06-18 DIAGNOSIS — R739 Hyperglycemia, unspecified: Secondary | ICD-10-CM | POA: Diagnosis not present

## 2016-06-18 DIAGNOSIS — N63 Unspecified lump in unspecified breast: Secondary | ICD-10-CM

## 2016-06-18 DIAGNOSIS — E7800 Pure hypercholesterolemia, unspecified: Secondary | ICD-10-CM | POA: Diagnosis not present

## 2016-06-18 DIAGNOSIS — G479 Sleep disorder, unspecified: Secondary | ICD-10-CM

## 2016-06-18 DIAGNOSIS — S99912A Unspecified injury of left ankle, initial encounter: Secondary | ICD-10-CM

## 2016-06-18 DIAGNOSIS — I10 Essential (primary) hypertension: Secondary | ICD-10-CM

## 2016-06-18 MED ORDER — LEVOTHYROXINE SODIUM 175 MCG PO TABS: 175.0000 ug | ORAL_TABLET | Freq: Every day | ORAL | 1 refills | Status: DC

## 2016-06-18 MED ORDER — SERTRALINE HCL 50 MG PO TABS: 50.0000 mg | ORAL_TABLET | Freq: Every day | ORAL | 1 refills | Status: DC

## 2016-06-18 NOTE — Progress Notes (Signed)
Patient ID: Jenna Winters, female   DOB: May 05, 1976, 40 y.o.   MRN: [id number]   Subjective:    Patient ID: Jenna Winters, female    DOB: 1976/05/12, 40 y.o.   MRN: [id number]  HPI  Patient here for a scheduled follow up.  Has been a while since I have seen her.  Due labs.  Sleeping well.  Taking melatonin at night.  This is working well.  Sleeping well. Saw Dr Caryl Bis 06/02/16 for ankle injury.  See note.  Swelling is better.  Pain is better.  Does not feel needs anything more at this point.  She is concerned regarding her weight.  Has gained more weight.  Discussed diet and exercise.  She is not watching what she eats.  Not exercising.  Discussed nutrition referral.  She was questioning and wanting to take pills to help her lose weight.  Discussed needed to work on diet and exercise.  Breathing stable.  No chest pain.  No acid reflux.  No abdominal pain or cramping.  Bowels stable.      Past Medical History:  Diagnosis Date  . Depression   . Hemorrhoid   . Hypertension   . Hyperthyroidism 2009   s/p ablation with resulting hypothyroidism   Past Surgical History:  Procedure Laterality Date  . ABDOMINAL HYSTERECTOMY  08/2011  . COLONOSCOPY    . DILATION AND CURETTAGE OF UTERUS  12/2010  . DILATION AND CURETTAGE OF UTERUS  1995  . HEMORRHOID SURGERY  08/2013   Family History  Problem Relation Age of Onset  . Hyperlipidemia Father   . Hypertension Father   . Diabetes Father   . Hyperlipidemia Paternal Grandmother   . Hypertension Paternal Grandmother   . Diabetes Paternal Grandmother   . Hyperlipidemia Paternal Grandfather   . Hypertension Paternal Grandfather   . Diabetes Paternal Grandfather   . Lung cancer Paternal Grandfather   . COPD Paternal Grandfather     lung   Social History   Social History  . Marital status: Divorced    Spouse name: N/A  . Number of children: 1  . Years of education: N/A   Occupational History  . [employer]  History Main Topics  . Smoking status: Never Smoker  . Smokeless tobacco: Never Used  . Alcohol use 0.0 oz/week     Comment: rare occasion  . Drug use: No  . Sexual activity: Not Asked   Other Topics Concern  . None   Social History Narrative  . None    Outpatient Encounter Prescriptions as of 06/18/2016  Medication Sig  . cholecalciferol (VITAMIN D) 1000 UNITS tablet Take 1,000 Units by mouth daily.  Marland Kitchen levothyroxine (SYNTHROID, LEVOTHROID) 175 MCG tablet Take 1 tablet (175 mcg total) by mouth daily before breakfast.  . lisinopril-hydrochlorothiazide (PRINZIDE,ZESTORETIC) 20-25 MG tablet Take 1 tablet by mouth daily.  . metoprolol succinate (TOPROL-XL) 50 MG 24 hr tablet Take 1 tablet (50 mg total) by mouth 2 (two) times daily. Take with or immediately following a meal.  . sertraline (ZOLOFT) 50 MG tablet Take 1 tablet (50 mg total) by mouth daily.  . [DISCONTINUED] levothyroxine (SYNTHROID, LEVOTHROID) 175 MCG tablet Take 1 tablet (175 mcg total) by mouth daily before breakfast.  . [DISCONTINUED] levothyroxine (SYNTHROID, LEVOTHROID) 175 MCG tablet Take 1 tablet by mouth  daily before breakfast  . [DISCONTINUED] lisinopril-hydrochlorothiazide (PRINZIDE,ZESTORETIC) 20-25 MG tablet Take 1 tablet by mouth  daily  . [DISCONTINUED] metoprolol succinate (TOPROL-XL) 50 MG  24 hr tablet Take 1 tablet by mouth  twice a day with or  immediately following a  meal  . [DISCONTINUED] sertraline (ZOLOFT) 50 MG tablet TAKE 1 TABLET BY MOUTH  DAILY   No facility-administered encounter medications on file as of 06/18/2016.     Review of Systems  Constitutional: Negative for appetite change.       Has gained weight.    HENT: Negative for congestion and sinus pressure.   Respiratory: Negative for cough, chest tightness and shortness of breath.   Cardiovascular: Negative for chest pain, palpitations and leg swelling.  Gastrointestinal: Negative for abdominal pain, diarrhea, nausea and vomiting.    Genitourinary: Negative for difficulty urinating and dysuria.  Musculoskeletal: Negative for back pain.       Ankle/foot better.  Swelling better.   Skin: Negative for color change and rash.  Neurological: Negative for dizziness, light-headedness and headaches.  Psychiatric/Behavioral: Negative for agitation and dysphoric mood.       Sleeping better with melatonin.        Objective:    Physical Exam  Constitutional: She appears well-developed and well-nourished. No distress.  HENT:  Nose: Nose normal.  Mouth/Throat: Oropharynx is clear and moist.  Neck: Neck supple. No thyromegaly present.  Cardiovascular: Normal rate and regular rhythm.   Pulmonary/Chest: Breath sounds normal. No respiratory distress. She has no wheezes.  Abdominal: Soft. Bowel sounds are normal. There is no tenderness.  Musculoskeletal: She exhibits no edema or tenderness.  No significant pain to palpation over her ankle.   No significant swelling.   Lymphadenopathy:    She has no cervical adenopathy.  Skin: No rash noted. No erythema.  Psychiatric: She has a normal mood and affect. Her behavior is normal.    BP 104/80 (BP Location: Left Arm, Patient Position: Sitting, Cuff Size: Large)   Pulse 99   Temp 98.7 F (37.1 C) (Oral)   Resp 20   Ht 5' 7"  (1.702 m)   Wt (!) 361 lb (163.7 kg)   SpO2 98%   BMI 56.54 kg/m  Wt Readings from Last 3 Encounters:  06/18/16 (!) 361 lb (163.7 kg)  06/02/16 (!) 360 lb 6.4 oz (163.5 kg)  08/28/15 (!) 350 lb 8 oz (159 kg)     Lab Results  Component Value Date   WBC 7.4 05/28/2015   HGB 13.3 05/28/2015   HCT 40 05/28/2015   PLT 259 05/28/2015   GLUCOSE 113 (H) 05/01/2013   CHOL 195 05/28/2015   TRIG 121 05/28/2015   HDL 34 (A) 05/28/2015   LDLCALC 137 05/28/2015   ALT 17 05/28/2015   AST 15 05/28/2015   NA 137 05/28/2015   K 4.7 05/28/2015   CL 99 05/01/2013   CREATININE 0.8 05/28/2015   BUN 15 05/28/2015   CO2 23 05/01/2013   TSH 2.93 05/28/2015        Assessment & Plan:   Problem List Items Addressed This Visit    Breast mass    S/p biopsy - Dr Bary Castilla.  Has been getting her mammograms through gyn.  Offered to schedule today.  She states she will call and schedule her mammogram.        Difficulty sleeping    Sleeping better with melatonin.  Follow.       Essential hypertension, benign    Blood pressure under good control.  Continue same medication regimen.  Follow pressures.  Follow metabolic panel.        Relevant Orders  CBC with Differential/Platelet   Basic metabolic panel   Hypercholesterolemia    Low cholesterol diet and exercise.  Follow lipid panel.       Relevant Orders   Lipid panel   Hepatic function panel   Hyperglycemia    Low carb diet and exercise.  Follow met b and a1c.        Relevant Orders   Hemoglobin A1c   Hypothyroidism (acquired)    On thyroid replacement.  Due tsh check.  Schedule lab.       Relevant Medications   levothyroxine (SYNTHROID, LEVOTHROID) 175 MCG tablet   Other Relevant Orders   TSH   Left ankle injury    Is better.  Swelling better.  Pain better.  Follow.        Severe obesity (BMI >= 40) (HCC)    Discussed diet and exercise.  Hold on diet pills.  Follow.         Other Visit Diagnoses   None.      Einar Pheasant, MD

## 2016-06-20 ENCOUNTER — Encounter: Payer: Self-pay | Admitting: Internal Medicine

## 2016-06-20 NOTE — Assessment & Plan Note (Signed)
Discussed diet and exercise.  Hold on diet pills.  Follow.

## 2016-06-20 NOTE — Assessment & Plan Note (Signed)
S/p biopsy - Dr Bary Castilla.  Has been getting her mammograms through gyn.  Offered to schedule today.  She states she will call and schedule her mammogram.

## 2016-06-20 NOTE — Assessment & Plan Note (Signed)
Is better.  Swelling better.  Pain better.  Follow.

## 2016-06-20 NOTE — Assessment & Plan Note (Signed)
Low cholesterol diet and exercise.  Follow lipid panel.   

## 2016-06-20 NOTE — Assessment & Plan Note (Signed)
Low carb diet and exercise.  Follow met b and a1c.   

## 2016-06-20 NOTE — Assessment & Plan Note (Signed)
Sleeping better with melatonin.  Follow.

## 2016-06-20 NOTE — Assessment & Plan Note (Signed)
Blood pressure under good control.  Continue same medication regimen.  Follow pressures.  Follow metabolic panel.   

## 2016-06-20 NOTE — Assessment & Plan Note (Signed)
On thyroid replacement.  Due tsh check.  Schedule lab.

## 2016-08-02 ENCOUNTER — Other Ambulatory Visit: Payer: Self-pay | Admitting: Certified Nurse Midwife

## 2016-08-02 DIAGNOSIS — Z1231 Encounter for screening mammogram for malignant neoplasm of breast: Secondary | ICD-10-CM

## 2016-08-10 ENCOUNTER — Telehealth: Payer: Self-pay | Admitting: *Deleted

## 2016-08-10 ENCOUNTER — Ambulatory Visit
Admission: RE | Admit: 2016-08-10 | Discharge: 2016-08-10 | Disposition: A | Discharge status: Home / Self Care | Payer: 59 | Source: Ambulatory Visit | Attending: Family | Admitting: Family

## 2016-08-10 ENCOUNTER — Telehealth: Payer: Self-pay

## 2016-08-10 ENCOUNTER — Ambulatory Visit (INDEPENDENT_AMBULATORY_CARE_PROVIDER_SITE_OTHER): Payer: 59 | Admitting: Family

## 2016-08-10 ENCOUNTER — Encounter: Payer: Self-pay | Admitting: Family

## 2016-08-10 VITALS — BP 124/80 | HR 98 | Temp 99.2°F | Wt 360.8 lb

## 2016-08-10 DIAGNOSIS — R1030 Lower abdominal pain, unspecified: Secondary | ICD-10-CM

## 2016-08-10 DIAGNOSIS — K5732 Diverticulitis of large intestine without perforation or abscess without bleeding: Secondary | ICD-10-CM

## 2016-08-10 DIAGNOSIS — M47814 Spondylosis without myelopathy or radiculopathy, thoracic region: Secondary | ICD-10-CM | POA: Insufficient documentation

## 2016-08-10 DIAGNOSIS — K760 Fatty (change of) liver, not elsewhere classified: Secondary | ICD-10-CM | POA: Diagnosis not present

## 2016-08-10 LAB — COMPREHENSIVE METABOLIC PANEL
A/G RATIO: 1.3 (ref 1.2–2.2)
ALBUMIN: 4.1 g/dL (ref 3.5–5.5)
ALT: 19 IU/L (ref 0–32)
AST: 12 IU/L (ref 0–40)
Alkaline Phosphatase: 102 IU/L (ref 39–117)
BUN/Creatinine Ratio: 13 (ref 9–23)
BUN: 8 mg/dL (ref 6–24)
Bilirubin Total: 0.8 mg/dL (ref 0.0–1.2)
CALCIUM: 8.8 mg/dL (ref 8.7–10.2)
CO2: 23 mmol/L (ref 18–29)
CREATININE: 0.61 mg/dL (ref 0.57–1.00)
Chloride: 97 mmol/L (ref 96–106)
GFR, EST AFRICAN AMERICAN: 131 mL/min/{1.73_m2} (ref >59)
GFR, EST NON AFRICAN AMERICAN: 114 mL/min/{1.73_m2} (ref >59)
GLOBULIN, TOTAL: 3.1 g/dL (ref 1.5–4.5)
Glucose: 144 mg/dL — ABNORMAL HIGH (ref 65–99)
Potassium: 3.9 mmol/L (ref 3.5–5.2)
SODIUM: 136 mmol/L (ref 134–144)
TOTAL PROTEIN: 7.2 g/dL (ref 6.0–8.5)

## 2016-08-10 LAB — CBC WITH DIFFERENTIAL/PLATELET
Basophils Absolute: 0 10*3/uL (ref 0.0–0.2)
Basos: 0 %
EOS (ABSOLUTE): 0.2 10*3/uL (ref 0.0–0.4)
EOS: 1 %
Hematocrit: 38.9 % (ref 34.0–46.6)
Hemoglobin: 13.5 g/dL (ref 11.1–15.9)
IMMATURE GRANS (ABS): 0 10*3/uL (ref 0.0–0.1)
IMMATURE GRANULOCYTES: 0 %
LYMPHS: 19 %
Lymphocytes Absolute: 2.2 10*3/uL (ref 0.7–3.1)
MCH: 29.8 pg (ref 26.6–33.0)
MCHC: 34.7 g/dL (ref 31.5–35.7)
MCV: 86 fL (ref 79–97)
MONOS ABS: 0.9 10*3/uL (ref 0.1–0.9)
Monocytes: 7 %
NEUTROS PCT: 73 %
Neutrophils Absolute: 8.3 10*3/uL — ABNORMAL HIGH (ref 1.4–7.0)
PLATELETS: 258 10*3/uL (ref 150–379)
RBC: 4.53 x10E6/uL (ref 3.77–5.28)
RDW: 14.2 % (ref 12.3–15.4)
WBC: 11.5 10*3/uL — AB (ref 3.4–10.8)

## 2016-08-10 LAB — POCT URINALYSIS DIPSTICK
BILIRUBIN UA: NEGATIVE
Blood, UA: NEGATIVE
GLUCOSE UA: 100
KETONES UA: NEGATIVE
LEUKOCYTES UA: NEGATIVE
NITRITE UA: NEGATIVE
PH UA: 5.5
Protein, UA: NEGATIVE
Spec Grav, UA: 1.015
Urobilinogen, UA: 0.2

## 2016-08-10 LAB — LIPASE: Lipase: 14 U/L (ref 0–59)

## 2016-08-10 MED ORDER — CIPROFLOXACIN HCL 500 MG PO TABS: 500.0000 mg | ORAL_TABLET | Freq: Two times a day (BID) | ORAL | 0 refills | Status: DC

## 2016-08-10 MED ORDER — METRONIDAZOLE 500 MG PO TABS: 500.0000 mg | ORAL_TABLET | Freq: Three times a day (TID) | ORAL | 0 refills | Status: DC

## 2016-08-10 MED ORDER — IOPAMIDOL (ISOVUE-300) INJECTION 61%
150.0000 mL | Freq: Once | INTRAVENOUS | Status: AC | PRN
  Administered 2016-08-10: 125 mL via INTRAVENOUS

## 2016-08-10 NOTE — Telephone Encounter (Signed)
I called patient and discussed with  her CT abdomen shows diverticulitis, uncomplicated.  I went ahead and sent dual antibiotic therapy to her pharmacy, [facility].  Please advise patient if she continues to have abdominal pain, fever, or any worsening symptoms, please go to emergency room as concern for complications, abscess or perforation. patient verbalized understanding.

## 2016-08-10 NOTE — Telephone Encounter (Signed)
Abnormal results WBC 11.5 neutrophils 8.3,  Glucose 144, lipase normal. Stat labs ordered by NP.  NP out of office routed and copied PCP and NP.

## 2016-08-10 NOTE — Telephone Encounter (Signed)
Call report on CT scan, Patient has acute uncomplicated Diverticulitis of the sigmoid colon.   Please advise

## 2016-08-10 NOTE — Progress Notes (Signed)
Pre visit review using our clinic review tool, if applicable. No additional management support is needed unless otherwise documented below in the visit note. 

## 2016-08-10 NOTE — Telephone Encounter (Signed)
I spoke with patient about diverticulitis and antibiotics.  CT shows incidental finding of fatty liver.   Dr. Nicki Reaper, should I proceed with further work up? Liver enzymes normal.

## 2016-08-10 NOTE — Patient Instructions (Addendum)
As we discussed, the etiology of your pain is nonspecific at this time. Pending lab workup and imaging. If it any point, your pain acutely worsens, please go to emergency room. Please keep cell phone with you today.

## 2016-08-10 NOTE — Progress Notes (Signed)
Subjective:    Patient ID: Jenna Winters, female    DOB: [birth year], 40 y.o.   MRN: [id number]  CC: Jenna Winters is a 40 y.o. female who presents today for an acute visit.    HPI: Patient here for acute visit with chief complaint of suprapubic pressure 4 days. Relieved with urination and tylenol. Occasional sharp. Pain doesn't radiate. Feels like menstrual cramps.'. No fever, chills.  No flank pain. Passing regular bowel movements and no increase in gas, belching. No concerns for STDs or recurrent UTIs. No change in vaginal discharge, or vaginal itching. Notes a little vaginal burning last week, since resolved. No fever, chills, nausea, vomiting. Eating, slightly decreased. Drinking fluids.   H/o hysterectomy.  No h/o renal stones.     Had colonoscopy a couple of years ago per patient; showed hemorrhoids. No divertiuculitis per patient. Unable to see records.   HISTORY:  Past Medical History:  Diagnosis Date  . Depression   . Hemorrhoid   . Hypertension   . Hyperthyroidism 2009   s/p ablation with resulting hypothyroidism   Past Surgical History:  Procedure Laterality Date  . ABDOMINAL HYSTERECTOMY  08/2011  . COLONOSCOPY    . DILATION AND CURETTAGE OF UTERUS  12/2010  . DILATION AND CURETTAGE OF UTERUS  1995  . HEMORRHOID SURGERY  08/2013   Family History  Problem Relation Age of Onset  . Hyperlipidemia Father   . Hypertension Father   . Diabetes Father   . Hyperlipidemia Paternal Grandmother   . Hypertension Paternal Grandmother   . Diabetes Paternal Grandmother   . Hyperlipidemia Paternal Grandfather   . Hypertension Paternal Grandfather   . Diabetes Paternal Grandfather   . Lung cancer Paternal Grandfather   . COPD Paternal Grandfather     lung    Allergies: Review of patient's allergies indicates no known allergies. Current Outpatient Prescriptions on File Prior to Visit  Medication Sig Dispense Refill  . cholecalciferol (VITAMIN D) 1000 UNITS tablet  Take 1,000 Units by mouth daily.    Marland Kitchen levothyroxine (SYNTHROID, LEVOTHROID) 175 MCG tablet Take 1 tablet (175 mcg total) by mouth daily before breakfast. 90 tablet 1  . lisinopril-hydrochlorothiazide (PRINZIDE,ZESTORETIC) 20-25 MG tablet Take 1 tablet by mouth daily. 90 tablet 3  . metoprolol succinate (TOPROL-XL) 50 MG 24 hr tablet Take 1 tablet (50 mg total) by mouth 2 (two) times daily. Take with or immediately following a meal. 180 tablet 3  . sertraline (ZOLOFT) 50 MG tablet Take 1 tablet (50 mg total) by mouth daily. 90 tablet 1   No current facility-administered medications on file prior to visit.     Social History  Substance Use Topics  . Smoking status: Never Smoker  . Smokeless tobacco: Never Used  . Alcohol use 0.0 oz/week     Comment: rare occasion    Review of Systems  Constitutional: Negative for chills and fever.  Respiratory: Negative for cough.   Cardiovascular: Negative for chest pain and palpitations.  Gastrointestinal: Negative for nausea and vomiting.  Genitourinary: Positive for frequency. Negative for decreased urine volume, dysuria, flank pain and hematuria.      Objective:    BP 124/80   Pulse (!) 108   Temp 99.2 F (37.3 C) (Oral)   Wt (!) 360 lb 12.8 oz (163.7 kg)   SpO2 97%   BMI 56.51 kg/m    Physical Exam  Constitutional: She appears well-developed and well-nourished.  Cardiovascular: Normal rate, regular rhythm, normal heart sounds and normal  pulses.   Pulmonary/Chest: Effort normal and breath sounds normal. She has no wheezes. She has no rhonchi. She has no rales.  Abdominal: Soft. Normal appearance and bowel sounds are normal. She exhibits no distension, no fluid wave, no ascites and no mass. There is tenderness in the suprapubic area. There is no rigidity, no guarding, no CVA tenderness, no tenderness at McBurney's point and negative Murphy's sign.    Neurological: She is alert.  Skin: Skin is warm and dry.  Psychiatric: She has a  normal mood and affect. Her speech is normal and behavior is normal. Thought content normal.  Vitals reviewed.      Assessment & Plan:  1. Suprapubic pressure, unspecified laterality Pain is nonspecific at this time.Afebrile. UA negative for nitrites, leukocytes, blood. No CVA tenderness. Patient has history of partial hysterectomy. Considering colitis, diverticulitis. Pending stat labs and CT evaluation.  - POCT urinalysis dipstick - CULTURE, URINE COMPREHENSIVE - Urine Culture - Comp Met (CMET) - Lipase - CBC with Differential - CT Abdomen Pelvis W Contrast    I am having Ms. Jenna Winters maintain her cholecalciferol, metoprolol succinate, lisinopril-hydrochlorothiazide, sertraline, and levothyroxine.   No orders of the defined types were placed in this encounter.   Return precautions given.   Risks, benefits, and alternatives of the medications and treatment plan prescribed today were discussed, and patient expressed understanding.   Education regarding symptom management and diagnosis given to patient on AVS.  Continue to follow with Einar Pheasant, MD for routine health maintenance.   Amyia D Kistler and I agreed with plan.   Mable Paris, FNP

## 2016-08-10 NOTE — Telephone Encounter (Signed)
Please notify pt that her white count is slightly elevated.  The remainder of her labs are ok.  Reviewed previous phone note.  Was seen by Joycelyn Schmid today.  CT with diverticulitis.  Already placed on abx.  Let us know if any problems.

## 2016-08-10 NOTE — Telephone Encounter (Signed)
I would just treat her acute issues first and then we can f/u regarding the fatty liver.  Thanks

## 2016-08-11 ENCOUNTER — Encounter: Payer: Self-pay | Admitting: Family

## 2016-08-11 ENCOUNTER — Ambulatory Visit: Payer: 59

## 2016-08-11 DIAGNOSIS — K760 Fatty (change of) liver, not elsewhere classified: Secondary | ICD-10-CM | POA: Insufficient documentation

## 2016-08-12 LAB — URINE CULTURE

## 2016-08-13 NOTE — Telephone Encounter (Signed)
Spoke to patient. She stated she is feeling loads better.  The antibiotic have been working says patient.  She will be rescheduling her appointment with Dr.scott for an earlier date.

## 2016-08-17 ENCOUNTER — Encounter: Payer: Self-pay | Admitting: Internal Medicine

## 2016-08-17 NOTE — Telephone Encounter (Signed)
I do need to see her, so please schedule her (or have someone schedule) her an appt before 09/21/16.   Thanks

## 2016-08-19 ENCOUNTER — Other Ambulatory Visit: Payer: Self-pay | Admitting: Family

## 2016-08-19 ENCOUNTER — Encounter: Payer: Self-pay | Admitting: Family

## 2016-08-19 DIAGNOSIS — R1030 Lower abdominal pain, unspecified: Secondary | ICD-10-CM

## 2016-08-24 ENCOUNTER — Ambulatory Visit: Payer: 59 | Admitting: Family

## 2016-08-24 ENCOUNTER — Encounter: Payer: Self-pay | Admitting: Family

## 2016-08-26 ENCOUNTER — Ambulatory Visit: Payer: 59 | Attending: Certified Nurse Midwife

## 2016-09-07 ENCOUNTER — Ambulatory Visit (INDEPENDENT_AMBULATORY_CARE_PROVIDER_SITE_OTHER): Payer: 59 | Admitting: Internal Medicine

## 2016-09-07 ENCOUNTER — Encounter: Payer: Self-pay | Admitting: Internal Medicine

## 2016-09-07 DIAGNOSIS — I10 Essential (primary) hypertension: Secondary | ICD-10-CM

## 2016-09-07 DIAGNOSIS — E7800 Pure hypercholesterolemia, unspecified: Secondary | ICD-10-CM

## 2016-09-07 DIAGNOSIS — Z23 Encounter for immunization: Secondary | ICD-10-CM

## 2016-09-07 DIAGNOSIS — E039 Hypothyroidism, unspecified: Secondary | ICD-10-CM | POA: Diagnosis not present

## 2016-09-07 DIAGNOSIS — F439 Reaction to severe stress, unspecified: Secondary | ICD-10-CM

## 2016-09-07 DIAGNOSIS — K760 Fatty (change of) liver, not elsewhere classified: Secondary | ICD-10-CM

## 2016-09-07 DIAGNOSIS — R739 Hyperglycemia, unspecified: Secondary | ICD-10-CM

## 2016-09-07 NOTE — Patient Instructions (Signed)
Saline nasal spray - flush nose at least 2-3x/day  nasacort nasal spray - 2 sprays each nostril one time per day.  Do this in the evening.    Restart zantac.

## 2016-09-07 NOTE — Progress Notes (Signed)
Pre visit review using our clinic review tool, if applicable. No additional management support is needed unless otherwise documented below in the visit note. 

## 2016-09-07 NOTE — Progress Notes (Signed)
Patient ID: Jenna Winters, female   DOB: [birth year], 40 y.o.   MRN: [id number]   Subjective:    Patient ID: Jenna Winters, female    DOB: 1976-01-27, 40 y.o.   MRN: [id number]  HPI  Patient here for a scheduled follow up.  She reports she is doing well.  No chest pain.  No sob.  No acid reflux.  No abdominal pain or cramping.  Bowels stable.  Recently had flare of diverticulitis.  This has cleared.  No abdominal pain now.  Discussed diet and exercise.  Information given.    Past Medical History:  Diagnosis Date  . Depression   . Hemorrhoid   . Hypertension   . Hyperthyroidism 2009   s/p ablation with resulting hypothyroidism   Past Surgical History:  Procedure Laterality Date  . ABDOMINAL HYSTERECTOMY  08/2011  . COLONOSCOPY    . DILATION AND CURETTAGE OF UTERUS  12/2010  . DILATION AND CURETTAGE OF UTERUS  1995  . HEMORRHOID SURGERY  08/2013   Family History  Problem Relation Age of Onset  . Hyperlipidemia Father   . Hypertension Father   . Diabetes Father   . Hyperlipidemia Paternal Grandmother   . Hypertension Paternal Grandmother   . Diabetes Paternal Grandmother   . Hyperlipidemia Paternal Grandfather   . Hypertension Paternal Grandfather   . Diabetes Paternal Grandfather   . Lung cancer Paternal Grandfather   . COPD Paternal Grandfather     lung   Social History   Social History  . Marital status: Divorced    Spouse name: N/A  . Number of children: 1  . Years of education: N/A   Occupational History  . [employer] History Main Topics  . Smoking status: Never Smoker  . Smokeless tobacco: Never Used  . Alcohol use 0.0 oz/week     Comment: rare occasion  . Drug use: No  . Sexual activity: Not Asked   Other Topics Concern  . None   Social History Narrative  . None    Outpatient Encounter Prescriptions as of 09/07/2016  Medication Sig  . cholecalciferol (VITAMIN D) 1000 UNITS tablet Take 1,000 Units by mouth daily.  Marland Kitchen  levothyroxine (SYNTHROID, LEVOTHROID) 175 MCG tablet Take 1 tablet (175 mcg total) by mouth daily before breakfast.  . lisinopril-hydrochlorothiazide (PRINZIDE,ZESTORETIC) 20-25 MG tablet Take 1 tablet by mouth daily.  . metoprolol succinate (TOPROL-XL) 50 MG 24 hr tablet Take 1 tablet (50 mg total) by mouth 2 (two) times daily. Take with or immediately following a meal.  . sertraline (ZOLOFT) 50 MG tablet Take 1 tablet (50 mg total) by mouth daily.  . [DISCONTINUED] ciprofloxacin (CIPRO) 500 MG tablet Take 1 tablet (500 mg total) by mouth 2 (two) times daily.  . [DISCONTINUED] metroNIDAZOLE (FLAGYL) 500 MG tablet Take 1 tablet (500 mg total) by mouth 3 (three) times daily.   No facility-administered encounter medications on file as of 09/07/2016.     Review of Systems  Constitutional: Negative for appetite change and unexpected weight change.  HENT: Negative for congestion and sinus pressure.   Respiratory: Negative for cough, chest tightness and shortness of breath.   Cardiovascular: Negative for chest pain, palpitations and leg swelling.  Gastrointestinal: Negative for abdominal pain, diarrhea, nausea and vomiting.  Genitourinary: Negative for difficulty urinating and dysuria.  Musculoskeletal: Negative for back pain and joint swelling.  Skin: Negative for color change and rash.  Neurological: Negative for dizziness, light-headedness and headaches.  Psychiatric/Behavioral: Negative  for agitation and dysphoric mood.       Objective:    Physical Exam  Constitutional: She appears well-developed and well-nourished. No distress.  HENT:  Nose: Nose normal.  Mouth/Throat: Oropharynx is clear and moist.  Neck: Neck supple. No thyromegaly present.  Cardiovascular: Normal rate and regular rhythm.   Pulmonary/Chest: Breath sounds normal. No respiratory distress. She has no wheezes.  Abdominal: Soft. Bowel sounds are normal. There is no tenderness.  Musculoskeletal: She exhibits no edema or  tenderness.  Lymphadenopathy:    She has no cervical adenopathy.  Skin: No rash noted. No erythema.  Psychiatric: She has a normal mood and affect. Her behavior is normal.    BP 102/60   Pulse 86   Temp 98.4 F (36.9 C) (Oral)   Ht 5' 7"  (1.702 m)   Wt (!) 359 lb 9.6 oz (163.1 kg)   SpO2 97%   BMI 56.32 kg/m  Wt Readings from Last 3 Encounters:  09/07/16 (!) 359 lb 9.6 oz (163.1 kg)  08/10/16 (!) 360 lb 12.8 oz (163.7 kg)  06/18/16 (!) 361 lb (163.7 kg)     Lab Results  Component Value Date   WBC 11.5 (H) 08/10/2016   HGB 13.3 05/28/2015   HCT 38.9 08/10/2016   PLT 258 08/10/2016   GLUCOSE 144 (H) 08/10/2016   CHOL 195 05/28/2015   TRIG 121 05/28/2015   HDL 34 (A) 05/28/2015   LDLCALC 137 05/28/2015   ALT 19 08/10/2016   AST 12 08/10/2016   NA 136 08/10/2016   K 3.9 08/10/2016   CL 97 08/10/2016   CREATININE 0.61 08/10/2016   BUN 8 08/10/2016   CO2 23 08/10/2016   TSH 2.93 05/28/2015    Ct Abdomen Pelvis W Contrast  Result Date: 08/10/2016 CLINICAL DATA:  Left lower quadrant pain for 4 days EXAM: CT ABDOMEN AND PELVIS WITH CONTRAST TECHNIQUE: Multidetector CT imaging of the abdomen and pelvis was performed using the standard protocol following bolus administration of intravenous contrast. CONTRAST:  149m ISOVUE-300 IOPAMIDOL (ISOVUE-300) INJECTION 61% COMPARISON:  None. FINDINGS: Lower chest: The lung bases are unremarkable. Hepatobiliary: Mild fatty infiltration of the liver. No intrahepatic biliary ductal dilatation. No calcified gallstones are noted within gallbladder. Pancreas: Enhanced pancreas is unremarkable. Spleen: Enhanced spleen is unremarkable. Adrenals/Urinary Tract: No adrenal gland mass. Enhanced kidneys are symmetrical in size. No hydronephrosis or hydroureter. Delayed renal images shows bilateral renal symmetrical excretion. Bilateral visualized proximal ureter is unremarkable. Stomach/Bowel: No gastric outlet obstruction. No small bowel obstruction.  No pericecal inflammation. Normal appendix. The terminal ileum is unremarkable. No distal colonic obstruction. Few diverticula are noted descending colon. Scattered diverticula are noted proximal sigmoid colon. Axial image 69 there is mild enhancement of diverticular wall in proximal sigmoid colon. Mild stranding of pericolonic fat. Findings are consistent with acute diverticulitis. No evidence of perforation. No pericolonic abscess. Vascular/Lymphatic: No aortic aneurysm. No retroperitoneal or mesenteric adenopathy. Reproductive: The visualized ovaries are unremarkable. The uterus is surgically absent. Other: The urinary bladder is unremarkable. No ascites or free abdominal air. Musculoskeletal: Mild degenerative changes bilateral SI joints. Sagittal images of the spine shows mild degenerative changes lower thoracic spine. IMPRESSION: 1. Axial image 69 there is mild enhancement of diverticular wall in proximal sigmoid colon. Mild stranding of pericolonic fat. Findings are consistent with acute diverticulitis. No evidence of perforation. No pericolonic abscess. 2. No small bowel obstruction. 3. No pericecal inflammation.  Normal appendix. 4. Surgically absent uterus.  Unremarkable ovaries. 5. Mild fatty infiltration of  the liver. 6. Mild degenerative changes lower thoracic spine. Electronically Signed   By: Lahoma Crocker M.D.   On: 08/10/2016 13:27       Assessment & Plan:   Problem List Items Addressed This Visit    Essential hypertension, benign    Blood pressure under good control.  Continue same medication regimen.  Follow pressures.  Follow metabolic panel.        Fatty infiltration of liver    Diet, exercise and weight loss.  Check liver function tests.  Follow.       Hypercholesterolemia    Low cholesterol diet and exercise.  Follow lipid panel.        Hyperglycemia    Low carb diet and exercise.  Follow met b and a1c.       Hypothyroidism (acquired)    On thyroid replacement.  Follow  tsh.       Severe obesity (BMI >= 40) (HCC)    Discussed diet and exercise.  Follow.        Stress    Doing well on zoloft.  Follow.        Other Visit Diagnoses    Encounter for immunization       Relevant Orders   Flu Vaccine QUAD 36+ mos IM (Completed)       Einar Pheasant, MD

## 2016-09-12 ENCOUNTER — Encounter: Payer: Self-pay | Admitting: Internal Medicine

## 2016-09-12 DIAGNOSIS — F439 Reaction to severe stress, unspecified: Secondary | ICD-10-CM | POA: Insufficient documentation

## 2016-09-12 NOTE — Assessment & Plan Note (Signed)
Low carb diet and exercise.  Follow met b and a1c.  

## 2016-09-12 NOTE — Assessment & Plan Note (Signed)
Discussed diet and exercise.  Follow.  

## 2016-09-12 NOTE — Assessment & Plan Note (Signed)
On thyroid replacement.  Follow tsh.  

## 2016-09-12 NOTE — Assessment & Plan Note (Signed)
Doing well on zoloft.  Follow.   

## 2016-09-12 NOTE — Assessment & Plan Note (Signed)
Blood pressure under good control.  Continue same medication regimen.  Follow pressures.  Follow metabolic panel.   

## 2016-09-12 NOTE — Assessment & Plan Note (Signed)
Diet, exercise and weight loss.  Check liver function tests.  Follow.

## 2016-09-12 NOTE — Assessment & Plan Note (Signed)
Low cholesterol diet and exercise.  Follow lipid panel.   

## 2016-09-15 LAB — CBC WITH DIFFERENTIAL/PLATELET
BASOS ABS: 0 10*3/uL (ref 0.0–0.2)
BASOS: 0 %
EOS (ABSOLUTE): 0.1 10*3/uL (ref 0.0–0.4)
Eos: 2 %
Hematocrit: 39.7 % (ref 34.0–46.6)
Hemoglobin: 13.4 g/dL (ref 11.1–15.9)
IMMATURE GRANS (ABS): 0 10*3/uL (ref 0.0–0.1)
Immature Granulocytes: 0 %
LYMPHS: 27 %
Lymphocytes Absolute: 2.4 10*3/uL (ref 0.7–3.1)
MCH: 29.3 pg (ref 26.6–33.0)
MCHC: 33.8 g/dL (ref 31.5–35.7)
MCV: 87 fL (ref 79–97)
MONOS ABS: 0.6 10*3/uL (ref 0.1–0.9)
Monocytes: 6 %
NEUTROS ABS: 5.7 10*3/uL (ref 1.4–7.0)
NEUTROS PCT: 65 %
PLATELETS: 261 10*3/uL (ref 150–379)
RBC: 4.57 x10E6/uL (ref 3.77–5.28)
RDW: 14.4 % (ref 12.3–15.4)
WBC: 8.8 10*3/uL (ref 3.4–10.8)

## 2016-09-15 LAB — BASIC METABOLIC PANEL
BUN / CREAT RATIO: 17 (ref 9–23)
BUN: 13 mg/dL (ref 6–24)
CALCIUM: 9.2 mg/dL (ref 8.7–10.2)
CHLORIDE: 96 mmol/L (ref 96–106)
CO2: 26 mmol/L (ref 18–29)
CREATININE: 0.76 mg/dL (ref 0.57–1.00)
GFR, EST AFRICAN AMERICAN: 113 mL/min/{1.73_m2} (ref >59)
GFR, EST NON AFRICAN AMERICAN: 98 mL/min/{1.73_m2} (ref >59)
Glucose: 122 mg/dL — ABNORMAL HIGH (ref 65–99)
Potassium: 4.4 mmol/L (ref 3.5–5.2)
Sodium: 138 mmol/L (ref 134–144)

## 2016-09-15 LAB — LIPID PANEL
CHOL/HDL RATIO: 6.2 ratio — AB (ref 0.0–4.4)
Cholesterol, Total: 228 mg/dL — ABNORMAL HIGH (ref 100–199)
HDL: 37 mg/dL — ABNORMAL LOW (ref >39)
LDL Calculated: 167 mg/dL — ABNORMAL HIGH (ref 0–99)
Triglycerides: 122 mg/dL (ref 0–149)
VLDL Cholesterol Cal: 24 mg/dL (ref 5–40)

## 2016-09-15 LAB — HEPATIC FUNCTION PANEL
ALBUMIN: 3.9 g/dL (ref 3.5–5.5)
ALT: 19 IU/L (ref 0–32)
AST: 16 IU/L (ref 0–40)
Alkaline Phosphatase: 108 IU/L (ref 39–117)
BILIRUBIN TOTAL: 1 mg/dL (ref 0.0–1.2)
BILIRUBIN, DIRECT: 0.19 mg/dL (ref 0.00–0.40)
TOTAL PROTEIN: 7.3 g/dL (ref 6.0–8.5)

## 2016-09-15 LAB — HEMOGLOBIN A1C
Est. average glucose Bld gHb Est-mCnc: 134 mg/dL
Hgb A1c MFr Bld: 6.3 % — ABNORMAL HIGH (ref 4.8–5.6)

## 2016-09-15 LAB — TSH: TSH: 6.17 u[IU]/mL — AB (ref 0.450–4.500)

## 2016-09-16 ENCOUNTER — Encounter: Payer: Self-pay | Admitting: Internal Medicine

## 2016-09-16 MED ORDER — LEVOTHYROXINE SODIUM 200 MCG PO TABS: 200.0000 ug | ORAL_TABLET | Freq: Every day | ORAL | 1 refills | Status: DC

## 2016-09-16 MED ORDER — ROSUVASTATIN CALCIUM 5 MG PO TABS: 5.0000 mg | ORAL_TABLET | Freq: Every day | ORAL | 1 refills | Status: DC

## 2016-09-16 NOTE — Telephone Encounter (Signed)
-----   Message from Einar Pheasant, MD sent at 09/16/2016  5:18 AM EDT ----- Notify pt that her cholesterol is elevated.  Low cholesterol diet and exercise.  I would also like to start her on cholesterol medication.  Start crestor 5mg  q day.  Will need liver panel checked 6 weeks after starting the medication.  Gets labs at [facility].  TSH is slightly elevated.  Confirm taking synthroid 145mcg daily and correctly.  If so, then increase synthroid to 251mcg q day.  Will need rx sent in.  Recheck tsh in 6 weeks.  Overall sugar control increased.  I would like to refer her to [facility] for diabetes education and diet instruction.  Hgb, white blood cell count, kidney function tests and liver function tests are wnl.

## 2016-09-17 ENCOUNTER — Telehealth: Payer: Self-pay | Admitting: Internal Medicine

## 2016-09-17 DIAGNOSIS — E7800 Pure hypercholesterolemia, unspecified: Secondary | ICD-10-CM

## 2016-09-17 DIAGNOSIS — R739 Hyperglycemia, unspecified: Secondary | ICD-10-CM

## 2016-09-17 DIAGNOSIS — E039 Hypothyroidism, unspecified: Secondary | ICD-10-CM

## 2016-09-17 NOTE — Telephone Encounter (Signed)
Orders placed for f/u labs and for referral to [facility].

## 2016-09-17 NOTE — Telephone Encounter (Signed)
-----   Message from Juanda Chance, [place] sent at 09/16/2016 11:25 AM EDT ----- Patient notified and will start crestor, I have sent in both prescriptions. Patient would like lab orders placed and would like referral

## 2016-09-19 ENCOUNTER — Other Ambulatory Visit: Payer: Self-pay | Admitting: Internal Medicine

## 2016-09-23 ENCOUNTER — Ambulatory Visit: Payer: 59 | Admitting: Internal Medicine

## 2016-10-08 ENCOUNTER — Ambulatory Visit: Payer: 59

## 2016-11-08 ENCOUNTER — Other Ambulatory Visit: Payer: Self-pay | Admitting: Internal Medicine

## 2016-11-08 ENCOUNTER — Encounter: Payer: Self-pay | Admitting: Internal Medicine

## 2016-11-09 ENCOUNTER — Other Ambulatory Visit: Payer: Self-pay | Admitting: Surgical

## 2016-11-09 MED ORDER — LISINOPRIL-HYDROCHLOROTHIAZIDE 20-25 MG PO TABS: 1.0000 | ORAL_TABLET | Freq: Every day | ORAL | 3 refills | Status: DC

## 2016-11-17 ENCOUNTER — Ambulatory Visit
Admission: RE | Admit: 2016-11-17 | Discharge: 2016-11-17 | Disposition: A | Discharge status: Home / Self Care | Payer: 59 | Source: Ambulatory Visit | Attending: Certified Nurse Midwife | Admitting: Certified Nurse Midwife

## 2016-11-17 DIAGNOSIS — Z1231 Encounter for screening mammogram for malignant neoplasm of breast: Secondary | ICD-10-CM | POA: Diagnosis not present

## 2016-12-21 ENCOUNTER — Ambulatory Visit (INDEPENDENT_AMBULATORY_CARE_PROVIDER_SITE_OTHER): Payer: 59 | Admitting: Internal Medicine

## 2016-12-21 ENCOUNTER — Encounter: Payer: Self-pay | Admitting: Internal Medicine

## 2016-12-21 DIAGNOSIS — K760 Fatty (change of) liver, not elsewhere classified: Secondary | ICD-10-CM

## 2016-12-21 DIAGNOSIS — E7800 Pure hypercholesterolemia, unspecified: Secondary | ICD-10-CM

## 2016-12-21 DIAGNOSIS — F439 Reaction to severe stress, unspecified: Secondary | ICD-10-CM | POA: Diagnosis not present

## 2016-12-21 DIAGNOSIS — R0683 Snoring: Secondary | ICD-10-CM

## 2016-12-21 DIAGNOSIS — E039 Hypothyroidism, unspecified: Secondary | ICD-10-CM

## 2016-12-21 DIAGNOSIS — I10 Essential (primary) hypertension: Secondary | ICD-10-CM

## 2016-12-21 DIAGNOSIS — R739 Hyperglycemia, unspecified: Secondary | ICD-10-CM | POA: Diagnosis not present

## 2016-12-21 MED ORDER — TRIAMCINOLONE ACETONIDE 0.1 % EX CREA: 1.0000 "application " | TOPICAL_CREAM | Freq: Two times a day (BID) | CUTANEOUS | 1 refills | Status: DC

## 2016-12-21 NOTE — Progress Notes (Signed)
Pre-visit discussion using our clinic review tool. No additional management support is needed unless otherwise documented below in the visit note.  

## 2016-12-21 NOTE — Progress Notes (Signed)
Patient ID: Jenna Winters, female   DOB: 11/04/1976, 41 y.o.   MRN: [id number]   Subjective:    Patient ID: Jenna Winters, female    DOB: September 02, 1976, 41 y.o.   MRN: [id number]  HPI  Patient here for a scheduled follow up.  She is doing well.  Is attending [employer].  Has lost weight.  Has adjusted her diet.  Feels better.  Is exercising.  Stress is better.  In a new relationship.  No chest pain.  No sob.  No acid reflux.  No abdominal pain or cramping.  Bowels stable.  She does report increased snoring.  Discussed sleep apnea.  She declines testing stating she cannot wear the mask.  She request referral to ENT for evaluation.  Discussed keeping airway open.     Past Medical History:  Diagnosis Date  . Depression   . Hemorrhoid   . Hypertension   . Hyperthyroidism 2009   s/p ablation with resulting hypothyroidism   Past Surgical History:  Procedure Laterality Date  . ABDOMINAL HYSTERECTOMY  08/2011  . BREAST BIOPSY Left 2015   core bx fibroadenoma  . COLONOSCOPY    . DILATION AND CURETTAGE OF UTERUS  12/2010  . DILATION AND CURETTAGE OF UTERUS  1995  . HEMORRHOID SURGERY  08/2013   Family History  Problem Relation Age of Onset  . Hyperlipidemia Father   . Hypertension Father   . Diabetes Father   . Hyperlipidemia Paternal Grandmother   . Hypertension Paternal Grandmother   . Diabetes Paternal Grandmother   . Hyperlipidemia Paternal Grandfather   . Hypertension Paternal Grandfather   . Diabetes Paternal Grandfather   . Lung cancer Paternal Grandfather   . COPD Paternal Grandfather     lung  . Breast cancer Neg Hx    Social History   Social History  . Marital status: Divorced    Spouse name: N/A  . Number of children: 1  . Years of education: N/A   Occupational History  . [place] History Main Topics  . Smoking status: Never Smoker  . Smokeless tobacco: Never Used  . Alcohol use 0.0 oz/week     Comment: rare occasion  . Drug  use: No  . Sexual activity: Not Asked   Other Topics Concern  . None   Social History Narrative  . None    Outpatient Encounter Prescriptions as of 12/21/2016  Medication Sig  . cholecalciferol (VITAMIN D) 1000 UNITS tablet Take 1,000 Units by mouth daily.  Marland Kitchen levothyroxine (SYNTHROID) 200 MCG tablet Take 1 tablet (200 mcg total) by mouth daily before breakfast.  . lisinopril-hydrochlorothiazide (PRINZIDE,ZESTORETIC) 20-25 MG tablet Take 1 tablet by mouth daily.  . metoprolol succinate (TOPROL-XL) 50 MG 24 hr tablet TAKE 1 TABLET BY MOUTH 2  TIMES DAILY. TAKE WITH OR  IMMEDIATELY FOLLOWING A  MEAL.  . rosuvastatin (CRESTOR) 5 MG tablet Take 1 tablet (5 mg total) by mouth daily.  . sertraline (ZOLOFT) 50 MG tablet Take 1 tablet (50 mg total) by mouth daily.  Marland Kitchen triamcinolone cream (KENALOG) 0.1 % Apply 1 application topically 2 (two) times daily.   No facility-administered encounter medications on file as of 12/21/2016.     Review of Systems  Constitutional: Negative for appetite change and unexpected weight change.  HENT: Negative for congestion and sinus pressure.   Respiratory: Negative for cough, chest tightness and shortness of breath.   Cardiovascular: Negative for chest pain, palpitations and leg swelling.  Gastrointestinal: Negative for abdominal pain, diarrhea, nausea and vomiting.  Genitourinary: Negative for difficulty urinating and dysuria.  Musculoskeletal: Negative for back pain and joint swelling.  Skin: Negative for color change and rash.  Neurological: Negative for dizziness, light-headedness and headaches.  Psychiatric/Behavioral: Negative for agitation and dysphoric mood.       Objective:    Physical Exam  Constitutional: She appears well-developed and well-nourished. No distress.  HENT:  Nose: Nose normal.  Mouth/Throat: Oropharynx is clear and moist.  Neck: Neck supple. No thyromegaly present.  Cardiovascular: Normal rate and regular rhythm.     Pulmonary/Chest: Breath sounds normal. No respiratory distress. She has no wheezes.  Abdominal: Soft. Bowel sounds are normal. There is no tenderness.  Musculoskeletal: She exhibits no edema or tenderness.  Lymphadenopathy:    She has no cervical adenopathy.  Skin: No rash noted. No erythema.  Psychiatric: She has a normal mood and affect. Her behavior is normal.    BP 108/72 (BP Location: Left Arm, Patient Position: Sitting, Cuff Size: Large)   Pulse 89   Temp 98.1 F (36.7 C) (Oral)   Ht _0  (1.702 m)   Wt (!) 336 lb (152.4 kg)   SpO2 99%   BMI 52.63 kg/m  Wt Readings from Last 3 Encounters:  12/21/16 (!) 336 lb (152.4 kg)  09/07/16 (!) 359 lb 9.6 oz (163.1 kg)  08/10/16 (!) 360 lb 12.8 oz (163.7 kg)     Lab Results  Component Value Date   WBC 8.8 09/14/2016   HGB 13.3 05/28/2015   HCT 39.7 09/14/2016   PLT 261 09/14/2016   GLUCOSE 122 (H) 09/14/2016   CHOL 228 (H) 09/14/2016   TRIG 122 09/14/2016   HDL 37 (L) 09/14/2016   LDLCALC 167 (H) 09/14/2016   ALT 19 09/14/2016   AST 16 09/14/2016   NA 138 09/14/2016   K 4.4 09/14/2016   CL 96 09/14/2016   CREATININE 0.76 09/14/2016   BUN 13 09/14/2016   CO2 26 09/14/2016   TSH 6.170 (H) 09/14/2016   HGBA1C 6.3 (H) 09/14/2016    Mm Screening Breast Tomo Bilateral  Result Date: 11/17/2016 CLINICAL DATA:  Screening. EXAM: 2D DIGITAL SCREENING BILATERAL MAMMOGRAM WITH CAD AND ADJUNCT TOMO COMPARISON:  Previous exam(s). ACR Breast Density Category b: There are scattered areas of fibroglandular density. FINDINGS: There are no findings suspicious for malignancy. Images were processed with CAD. IMPRESSION: No mammographic evidence of malignancy. A result letter of this screening mammogram will be mailed directly to the patient. RECOMMENDATION: Screening mammogram in one year. (Code:SM-B-01Y) BI-RADS CATEGORY  1: Negative. Electronically Signed   By: Ammie Ferrier M.D.   On: 11/17/2016 11:22       Assessment & Plan:    Problem List Items Addressed This Visit    Essential hypertension, benign    Blood pressure under good control.  Continue same medication regimen.  Follow pressures.  Follow metabolic panel.        Fatty infiltration of liver    Continue diet, exercise and weight loss.        Hypercholesterolemia    Low cholesterol diet and exercise.  Follow lipid panel.        Hyperglycemia    Low carb diet and exercise.  Follow met b and a1c.        Hypothyroidism (acquired)    On thyroid replacement.  Follow tsh.        Severe obesity (BMI >= 40) (HCC)    Continue weight  loss, diet and exercise.  Follow.       Snoring    Discussed sleep study.  She declines.  States cannot wear the mask.  Instructed to keep nasal passages open.  She request ENT referral for evaluation for snoring.        Relevant Orders   Ambulatory referral to ENT   Stress    Doing well on zoloft.  Feels better.  Follow.            Einar Pheasant, MD

## 2016-12-26 ENCOUNTER — Encounter: Payer: Self-pay | Admitting: Internal Medicine

## 2016-12-26 DIAGNOSIS — R0683 Snoring: Secondary | ICD-10-CM | POA: Insufficient documentation

## 2016-12-26 NOTE — Assessment & Plan Note (Signed)
Low carb diet and exercise.  Follow met b and a1c.   

## 2016-12-26 NOTE — Assessment & Plan Note (Signed)
Continue weight loss, diet and exercise.  Follow.

## 2016-12-26 NOTE — Assessment & Plan Note (Signed)
Doing well on zoloft.  Feels better.  Follow.   

## 2016-12-26 NOTE — Assessment & Plan Note (Signed)
Low cholesterol diet and exercise.  Follow lipid panel.   

## 2016-12-26 NOTE — Assessment & Plan Note (Signed)
On thyroid replacement.  Follow tsh.  

## 2016-12-26 NOTE — Assessment & Plan Note (Signed)
Discussed sleep study.  She declines.  States cannot wear the mask.  Instructed to keep nasal passages open.  She request ENT referral for evaluation for snoring.

## 2016-12-26 NOTE — Assessment & Plan Note (Signed)
Continue diet, exercise and weight loss.

## 2016-12-26 NOTE — Assessment & Plan Note (Signed)
Blood pressure under good control.  Continue same medication regimen.  Follow pressures.  Follow metabolic panel.   

## 2017-01-14 ENCOUNTER — Encounter: Payer: Self-pay | Admitting: Internal Medicine

## 2017-01-16 NOTE — Telephone Encounter (Signed)
Do they have a physician at their facility (or can the medical director) order the labs needed.  If they are requiring these labs, they should have someone there to order and f/u on results.  Just let me know.

## 2017-03-09 ENCOUNTER — Other Ambulatory Visit: Payer: Self-pay | Admitting: Internal Medicine

## 2017-03-22 ENCOUNTER — Ambulatory Visit: Payer: 59 | Admitting: Internal Medicine

## 2017-04-02 ENCOUNTER — Other Ambulatory Visit: Payer: Self-pay | Admitting: Internal Medicine

## 2017-04-12 ENCOUNTER — Other Ambulatory Visit: Payer: Self-pay

## 2017-04-12 ENCOUNTER — Telehealth: Payer: Self-pay | Admitting: *Deleted

## 2017-04-12 ENCOUNTER — Telehealth: Payer: Self-pay | Admitting: Internal Medicine

## 2017-04-12 DIAGNOSIS — E7800 Pure hypercholesterolemia, unspecified: Secondary | ICD-10-CM

## 2017-04-12 DIAGNOSIS — E039 Hypothyroidism, unspecified: Secondary | ICD-10-CM

## 2017-04-12 DIAGNOSIS — I10 Essential (primary) hypertension: Secondary | ICD-10-CM

## 2017-04-12 DIAGNOSIS — R739 Hyperglycemia, unspecified: Secondary | ICD-10-CM

## 2017-04-12 LAB — HEPATIC FUNCTION PANEL
ALBUMIN: 3.8 g/dL (ref 3.5–5.5)
ALK PHOS: 85 IU/L (ref 39–117)
ALT: 19 IU/L (ref 0–32)
AST: 17 IU/L (ref 0–40)
Bilirubin Total: 0.5 mg/dL (ref 0.0–1.2)
Bilirubin, Direct: 0.13 mg/dL (ref 0.00–0.40)
TOTAL PROTEIN: 6.5 g/dL (ref 6.0–8.5)

## 2017-04-12 LAB — TSH: TSH: 0.272 u[IU]/mL — ABNORMAL LOW (ref 0.450–4.500)

## 2017-04-12 MED ORDER — LEVOTHYROXINE SODIUM 175 MCG PO TABS: 175.0000 ug | ORAL_TABLET | Freq: Every day | ORAL | 0 refills | Status: DC

## 2017-04-12 NOTE — Telephone Encounter (Signed)
Patient requested to be called at [phone], possibly in reference to lab results

## 2017-04-12 NOTE — Telephone Encounter (Signed)
Orders placed for f/u [facility] labs.

## 2017-04-12 NOTE — Telephone Encounter (Signed)
See lab results.  

## 2017-06-20 ENCOUNTER — Ambulatory Visit: Payer: 59 | Admitting: Internal Medicine

## 2017-07-01 ENCOUNTER — Other Ambulatory Visit: Payer: Self-pay | Admitting: Internal Medicine

## 2017-07-14 ENCOUNTER — Other Ambulatory Visit: Payer: Self-pay | Admitting: Internal Medicine

## 2017-08-22 ENCOUNTER — Other Ambulatory Visit: Payer: Self-pay | Admitting: Internal Medicine

## 2017-08-30 ENCOUNTER — Encounter: Payer: Self-pay | Admitting: Internal Medicine

## 2017-08-30 ENCOUNTER — Ambulatory Visit (INDEPENDENT_AMBULATORY_CARE_PROVIDER_SITE_OTHER): Payer: 59 | Admitting: Internal Medicine

## 2017-08-30 DIAGNOSIS — E7800 Pure hypercholesterolemia, unspecified: Secondary | ICD-10-CM | POA: Diagnosis not present

## 2017-08-30 DIAGNOSIS — F439 Reaction to severe stress, unspecified: Secondary | ICD-10-CM

## 2017-08-30 DIAGNOSIS — R739 Hyperglycemia, unspecified: Secondary | ICD-10-CM

## 2017-08-30 DIAGNOSIS — E039 Hypothyroidism, unspecified: Secondary | ICD-10-CM

## 2017-08-30 DIAGNOSIS — I10 Essential (primary) hypertension: Secondary | ICD-10-CM

## 2017-08-30 DIAGNOSIS — Z6841 Body Mass Index (BMI) 40.0 and over, adult: Secondary | ICD-10-CM | POA: Diagnosis not present

## 2017-08-30 DIAGNOSIS — K760 Fatty (change of) liver, not elsewhere classified: Secondary | ICD-10-CM

## 2017-08-30 DIAGNOSIS — Z23 Encounter for immunization: Secondary | ICD-10-CM

## 2017-08-30 NOTE — Progress Notes (Signed)
Patient ID: Jenna Winters, female   DOB: 1976/05/02, 41 y.o.   MRN: [id number]   Subjective:    Patient ID: Jenna Winters, female    DOB: 12-19-1975, 41 y.o.   MRN: [id number]  HPI  Patient here for a scheduled follow up. She reports she is doing relatively well.  Engaged recently.  States relationship going well.  No chest pain.  No sob.  Has lost weight.  Maintained from the previous check.  Plans to continue with diet adjustment and start exercising more.  No acid reflux.  No abdominal pain.  Bowels moving.  Blood pressure doing well.     Past Medical History:  Diagnosis Date  . Depression   . Hemorrhoid   . Hypertension   . Hyperthyroidism 2009   s/p ablation with resulting hypothyroidism   Past Surgical History:  Procedure Laterality Date  . ABDOMINAL HYSTERECTOMY  08/2011  . BREAST BIOPSY Left 2015   core bx fibroadenoma  . COLONOSCOPY    . DILATION AND CURETTAGE OF UTERUS  12/2010  . DILATION AND CURETTAGE OF UTERUS  1995  . HEMORRHOID SURGERY  08/2013   Family History  Problem Relation Age of Onset  . Hyperlipidemia Father   . Hypertension Father   . Diabetes Father   . Hyperlipidemia Paternal Grandmother   . Hypertension Paternal Grandmother   . Diabetes Paternal Grandmother   . Hyperlipidemia Paternal Grandfather   . Hypertension Paternal Grandfather   . Diabetes Paternal Grandfather   . Lung cancer Paternal Grandfather   . COPD Paternal Grandfather        lung  . Breast cancer Neg Hx    Social History   Social History  . Marital status: Divorced    Spouse name: N/A  . Number of children: 1  . Years of education: N/A   Occupational History  . [place] History Main Topics  . Smoking status: Never Smoker  . Smokeless tobacco: Never Used  . Alcohol use 0.0 oz/week     Comment: rare occasion  . Drug use: No  . Sexual activity: Not Asked   Other Topics Concern  . None   Social History Narrative  . None    Outpatient  Encounter Prescriptions as of 08/30/2017  Medication Sig  . cholecalciferol (VITAMIN D) 1000 UNITS tablet Take 1,000 Units by mouth daily.  Marland Kitchen levothyroxine (SYNTHROID, LEVOTHROID) 175 MCG tablet TAKE 1 TABLET BY MOUTH  DAILY BEFORE BREAKFAST  . lisinopril-hydrochlorothiazide (PRINZIDE,ZESTORETIC) 20-25 MG tablet Take 1 tablet by mouth daily.  . metoprolol succinate (TOPROL-XL) 50 MG 24 hr tablet TAKE 1 TABLET BY MOUTH 2  TIMES DAILY. TAKE WITH OR  IMMEDIATELY FOLLOWING A  MEAL.  . rosuvastatin (CRESTOR) 5 MG tablet TAKE 1 TABLET BY MOUTH  DAILY  . sertraline (ZOLOFT) 50 MG tablet TAKE 1 TABLET BY MOUTH  DAILY  . triamcinolone cream (KENALOG) 0.1 % Apply 1 application topically 2 (two) times daily.   No facility-administered encounter medications on file as of 08/30/2017.     Review of Systems  Constitutional: Negative for appetite change and unexpected weight change.  HENT: Negative for congestion and sinus pressure.   Respiratory: Negative for cough, chest tightness and shortness of breath.   Cardiovascular: Negative for chest pain, palpitations and leg swelling.  Gastrointestinal: Negative for abdominal pain, diarrhea, nausea and vomiting.  Genitourinary: Negative for difficulty urinating and dysuria.  Musculoskeletal: Negative for joint swelling and myalgias.  Skin: Negative for color  change and rash.  Neurological: Negative for dizziness, light-headedness and headaches.  Psychiatric/Behavioral: Negative for agitation and dysphoric mood.       Objective:     Blood pressure rechecked by me:  126/82  Physical Exam  Constitutional: She appears well-developed and well-nourished. No distress.  HENT:  Nose: Nose normal.  Mouth/Throat: Oropharynx is clear and moist.  Neck: Neck supple. No thyromegaly present.  Cardiovascular: Normal rate and regular rhythm.   Pulmonary/Chest: Breath sounds normal. No respiratory distress. She has no wheezes.  Abdominal: Soft. Bowel sounds are normal.  There is no tenderness.  Musculoskeletal: She exhibits no edema or tenderness.  Lymphadenopathy:    She has no cervical adenopathy.  Skin: No rash noted. No erythema.  Psychiatric: She has a normal mood and affect. Her behavior is normal.    BP 110/70 (BP Location: Left Arm, Patient Position: Sitting, Cuff Size: Large)   Pulse 84   Temp 98.9 F (37.2 C) (Oral)   Resp 14   Wt (!) 334 lb (151.5 kg)   SpO2 98%   BMI 52.31 kg/m  Wt Readings from Last 3 Encounters:  08/30/17 (!) 334 lb (151.5 kg)  12/21/16 (!) 336 lb (152.4 kg)  09/07/16 (!) 359 lb 9.6 oz (163.1 kg)     Lab Results  Component Value Date   WBC 8.8 09/14/2016   HGB 13.4 09/14/2016   HCT 39.7 09/14/2016   PLT 261 09/14/2016   GLUCOSE 122 (H) 09/14/2016   CHOL 228 (H) 09/14/2016   TRIG 122 09/14/2016   HDL 37 (L) 09/14/2016   LDLCALC 167 (H) 09/14/2016   ALT 19 04/11/2017   AST 17 04/11/2017   NA 138 09/14/2016   K 4.4 09/14/2016   CL 96 09/14/2016   CREATININE 0.76 09/14/2016   BUN 13 09/14/2016   CO2 26 09/14/2016   TSH 0.272 (L) 04/11/2017   HGBA1C 6.3 (H) 09/14/2016    Mm Screening Breast Tomo Bilateral  Result Date: 11/17/2016 CLINICAL DATA:  Screening. EXAM: 2D DIGITAL SCREENING BILATERAL MAMMOGRAM WITH CAD AND ADJUNCT TOMO COMPARISON:  Previous exam(s). ACR Breast Density Category b: There are scattered areas of fibroglandular density. FINDINGS: There are no findings suspicious for malignancy. Images were processed with CAD. IMPRESSION: No mammographic evidence of malignancy. A result letter of this screening mammogram will be mailed directly to the patient. RECOMMENDATION: Screening mammogram in one year. (Code:SM-B-01Y) BI-RADS CATEGORY  1: Negative. Electronically Signed   By: Ammie Ferrier M.D.   On: 11/17/2016 11:22       Assessment & Plan:   Problem List Items Addressed This Visit    BMI 50.0-59.9, adult ([place])    Discussed diet and exercise.  Follow.        Essential hypertension,  benign    Blood pressure under good control.  Continue same medication regimen.  Follow pressures.  Follow metabolic panel.        Fatty infiltration of liver    Continue diet and exercise.  Follow liver function tests.  Discussed weight loss.        Hypercholesterolemia    Low cholesterol diet and exercise.  On crestor.  Follow lipid panel and liver function tests.        Hyperglycemia    Low carb diet and exercise.  Follow met b and a1c.        Hypothyroidism (acquired)    On thyroid replacement.  Follow tsh.        Stress    Doing  well on zoloft.  Feels better.  Follow.         Other Visit Diagnoses    Need for immunization against influenza       Relevant Orders   Flu Vaccine QUAD 36+ mos IM (Completed)       Einar Pheasant, MD

## 2017-09-02 ENCOUNTER — Encounter: Payer: Self-pay | Admitting: Internal Medicine

## 2017-09-02 NOTE — Assessment & Plan Note (Signed)
On thyroid replacement.  Follow tsh.  

## 2017-09-02 NOTE — Assessment & Plan Note (Signed)
Blood pressure under good control.  Continue same medication regimen.  Follow pressures.  Follow metabolic panel.   

## 2017-09-02 NOTE — Assessment & Plan Note (Signed)
Continue diet and exercise.  Follow liver function tests.  Discussed weight loss.

## 2017-09-02 NOTE — Assessment & Plan Note (Signed)
Low cholesterol diet and exercise.  On crestor.  Follow lipid panel and liver function tests.  

## 2017-09-02 NOTE — Assessment & Plan Note (Signed)
Low carb diet and exercise.  Follow met b and a1c.   

## 2017-09-02 NOTE — Assessment & Plan Note (Signed)
Discussed diet and exercise.  Follow.  

## 2017-09-02 NOTE — Assessment & Plan Note (Signed)
Doing well on zoloft.  Feels better.  Follow.

## 2017-09-28 ENCOUNTER — Encounter: Payer: Self-pay | Admitting: Internal Medicine

## 2017-09-28 ENCOUNTER — Other Ambulatory Visit: Payer: Self-pay | Admitting: Internal Medicine

## 2017-10-01 LAB — LIPID PANEL
CHOLESTEROL TOTAL: 147 mg/dL (ref 100–199)
Chol/HDL Ratio: 4.1 ratio (ref 0.0–4.4)
HDL: 36 mg/dL — AB (ref >39)
LDL Calculated: 88 mg/dL (ref 0–99)
TRIGLYCERIDES: 116 mg/dL (ref 0–149)
VLDL Cholesterol Cal: 23 mg/dL (ref 5–40)

## 2017-10-01 LAB — BASIC METABOLIC PANEL
BUN / CREAT RATIO: 16 (ref 9–23)
BUN: 12 mg/dL (ref 6–24)
CALCIUM: 9 mg/dL (ref 8.7–10.2)
CHLORIDE: 100 mmol/L (ref 96–106)
CO2: 26 mmol/L (ref 20–29)
Creatinine, Ser: 0.74 mg/dL (ref 0.57–1.00)
GFR calc non Af Amer: 100 mL/min/{1.73_m2} (ref >59)
GFR, EST AFRICAN AMERICAN: 116 mL/min/{1.73_m2} (ref >59)
GLUCOSE: 129 mg/dL — AB (ref 65–99)
POTASSIUM: 4.8 mmol/L (ref 3.5–5.2)
Sodium: 140 mmol/L (ref 134–144)

## 2017-10-01 LAB — HEMOGLOBIN A1C
Est. average glucose Bld gHb Est-mCnc: 117 mg/dL
HEMOGLOBIN A1C: 5.7 % — AB (ref 4.8–5.6)

## 2017-10-01 LAB — HEPATIC FUNCTION PANEL
ALK PHOS: 90 IU/L (ref 39–117)
ALT: 21 IU/L (ref 0–32)
AST: 17 IU/L (ref 0–40)
Albumin: 3.9 g/dL (ref 3.5–5.5)
Bilirubin Total: 0.6 mg/dL (ref 0.0–1.2)
Bilirubin, Direct: 0.16 mg/dL (ref 0.00–0.40)
TOTAL PROTEIN: 6.8 g/dL (ref 6.0–8.5)

## 2017-10-01 LAB — TSH: TSH: 1.04 u[IU]/mL (ref 0.450–4.500)

## 2017-10-03 ENCOUNTER — Encounter: Payer: Self-pay | Admitting: Internal Medicine

## 2017-11-01 ENCOUNTER — Encounter: Payer: Self-pay | Admitting: Internal Medicine

## 2017-11-01 DIAGNOSIS — Z0000 Encounter for general adult medical examination without abnormal findings: Secondary | ICD-10-CM | POA: Insufficient documentation

## 2017-11-16 ENCOUNTER — Other Ambulatory Visit: Payer: Self-pay | Admitting: Internal Medicine

## 2018-01-03 ENCOUNTER — Encounter: Payer: Self-pay | Admitting: Internal Medicine

## 2018-01-03 ENCOUNTER — Ambulatory Visit: Payer: Managed Care, Other (non HMO) | Admitting: Internal Medicine

## 2018-01-03 VITALS — BP 118/76 | HR 86 | Temp 98.9°F | Resp 16 | Wt 347.0 lb

## 2018-01-03 DIAGNOSIS — E039 Hypothyroidism, unspecified: Secondary | ICD-10-CM | POA: Diagnosis not present

## 2018-01-03 DIAGNOSIS — Z6841 Body Mass Index (BMI) 40.0 and over, adult: Secondary | ICD-10-CM

## 2018-01-03 DIAGNOSIS — I10 Essential (primary) hypertension: Secondary | ICD-10-CM | POA: Diagnosis not present

## 2018-01-03 DIAGNOSIS — E7800 Pure hypercholesterolemia, unspecified: Secondary | ICD-10-CM

## 2018-01-03 DIAGNOSIS — R739 Hyperglycemia, unspecified: Secondary | ICD-10-CM

## 2018-01-03 DIAGNOSIS — F439 Reaction to severe stress, unspecified: Secondary | ICD-10-CM | POA: Diagnosis not present

## 2018-01-03 DIAGNOSIS — Z1231 Encounter for screening mammogram for malignant neoplasm of breast: Secondary | ICD-10-CM

## 2018-01-03 DIAGNOSIS — R8290 Unspecified abnormal findings in urine: Secondary | ICD-10-CM | POA: Diagnosis not present

## 2018-01-03 DIAGNOSIS — Z1239 Encounter for other screening for malignant neoplasm of breast: Secondary | ICD-10-CM

## 2018-01-03 DIAGNOSIS — K760 Fatty (change of) liver, not elsewhere classified: Secondary | ICD-10-CM

## 2018-01-03 MED ORDER — SERTRALINE HCL 100 MG PO TABS: 100.0000 mg | ORAL_TABLET | Freq: Every day | ORAL | 1 refills | Status: DC

## 2018-01-03 NOTE — Assessment & Plan Note (Signed)
Increased stress as outlined.  Discussed treatment options today.  Will increase zoloft to 100mg  q day.  Schedule f/u.  Notify me if problems.

## 2018-01-03 NOTE — Assessment & Plan Note (Signed)
Continue diet and exercise.  Weight loss.  Follow liver function tests.  

## 2018-01-03 NOTE — Assessment & Plan Note (Signed)
Low carb diet and exercise.  Follow met b and a1c.

## 2018-01-03 NOTE — Assessment & Plan Note (Signed)
On crestor.  Low cholesterol diet and exercise.  Follow lipid panel and liver function tests.   

## 2018-01-03 NOTE — Progress Notes (Signed)
Patient ID: Jenna Winters, female   DOB: 21-Oct-1976, 42 y.o.   MRN: [id number]   Subjective:    Patient ID: Jenna Winters, female    DOB: 07/17/1976, 42 y.o.   MRN: [id number]  HPI  Patient here for a scheduled follow up.  She reports increased stress with work and getting married, finding a house, etc.  Discussed with her today.  She states things at work appear to be getting better.  Feels she needs to increase the dose of zoloft.  Will increase to 19m q day.  Does not feels she needs anything more at this time.  Not exercising.  Discussed the need to exercise.  Also discussed diet adjustment.  No chest pain.  No sob.  No acid reflux.  No abdominal pain.  Bowels moving.  Some odor with her urine.  No dysuria.  Wants urine checked to confirm no infection.    Past Medical History:  Diagnosis Date  . Depression   . Hemorrhoid   . Hypertension   . Hyperthyroidism 2009   s/p ablation with resulting hypothyroidism   Past Surgical History:  Procedure Laterality Date  . ABDOMINAL HYSTERECTOMY  08/2011  . BREAST BIOPSY Left 2015   core bx fibroadenoma  . COLONOSCOPY    . DILATION AND CURETTAGE OF UTERUS  12/2010  . DILATION AND CURETTAGE OF UTERUS  1995  . HEMORRHOID SURGERY  08/2013   Family History  Problem Relation Age of Onset  . Hyperlipidemia Father   . Hypertension Father   . Diabetes Father   . Hyperlipidemia Paternal Grandmother   . Hypertension Paternal Grandmother   . Diabetes Paternal Grandmother   . Hyperlipidemia Paternal Grandfather   . Hypertension Paternal Grandfather   . Diabetes Paternal Grandfather   . Lung cancer Paternal Grandfather   . COPD Paternal Grandfather        lung  . Breast cancer Neg Hx    Social History   Socioeconomic History  . Marital status: Divorced    Spouse name: None  . Number of children: 1  . Years of education: None  . Highest education level: None  Social Needs  . Financial resource strain: None  . Food insecurity -  worry: None  . Food insecurity - inability: None  . Transportation needs - medical: None  . Transportation needs - non-medical: None  Occupational History  . Occupation: [employer], retail [employer]  Tobacco Use  . Smoking status: Never Smoker  . Smokeless tobacco: Never Used  Substance and Sexual Activity  . Alcohol use: Yes    Alcohol/week: 0.0 oz    Comment: rare occasion  . Drug use: No  . Sexual activity: None  Other Topics Concern  . None  Social History Narrative  . None    Outpatient Encounter Medications as of 01/03/2018  Medication Sig  . cholecalciferol (VITAMIN D) 1000 UNITS tablet Take 1,000 Units by mouth daily.  .Marland Kitchenlevothyroxine (SYNTHROID, LEVOTHROID) 175 MCG tablet TAKE 1 TABLET BY MOUTH  DAILY BEFORE BREAKFAST  . lisinopril-hydrochlorothiazide (PRINZIDE,ZESTORETIC) 20-25 MG tablet TAKE 1 TABLET BY MOUTH  DAILY  . metoprolol succinate (TOPROL-XL) 50 MG 24 hr tablet TAKE 1 TABLET BY MOUTH 2  TIMES DAILY. TAKE WITH OR  IMMEDIATELY FOLLOWING  MEAL(S).  . rosuvastatin (CRESTOR) 5 MG tablet TAKE 1 TABLET BY MOUTH  DAILY  . triamcinolone cream (KENALOG) 0.1 % Apply 1 application topically 2 (two) times daily.  . [DISCONTINUED] sertraline (ZOLOFT) 50 MG  tablet TAKE 1 TABLET BY MOUTH  DAILY  . sertraline (ZOLOFT) 100 MG tablet Take 1 tablet (100 mg total) by mouth daily.   No facility-administered encounter medications on file as of 01/03/2018.     Review of Systems  Constitutional: Negative for appetite change and unexpected weight change.  HENT: Negative for congestion and sinus pressure.   Respiratory: Negative for cough, chest tightness and shortness of breath.   Cardiovascular: Negative for chest pain, palpitations and leg swelling.  Gastrointestinal: Negative for abdominal pain, diarrhea, nausea and vomiting.  Genitourinary: Negative for dysuria.       Bad odor - urine.   Musculoskeletal: Negative for joint swelling and myalgias.  Skin: Negative for color  change and rash.  Neurological: Negative for dizziness, light-headedness and headaches.  Psychiatric/Behavioral: Negative for agitation and dysphoric mood.       Objective:     Blood pressure rechecked by me:  122/80  Physical Exam  Constitutional: She appears well-developed and well-nourished. No distress.  HENT:  Nose: Nose normal.  Mouth/Throat: Oropharynx is clear and moist.  Neck: Neck supple. No thyromegaly present.  Cardiovascular: Normal rate and regular rhythm.  Pulmonary/Chest: Breath sounds normal. No respiratory distress. She has no wheezes.  Abdominal: Soft. Bowel sounds are normal. There is no tenderness.  Musculoskeletal: She exhibits no edema or tenderness.  Lymphadenopathy:    She has no cervical adenopathy.  Skin: No rash noted. No erythema.  Psychiatric: She has a normal mood and affect. Her behavior is normal.    BP 118/76 (BP Location: Left Arm, Patient Position: Sitting, Cuff Size: Large)   Pulse 86   Temp 98.9 F (37.2 C) (Oral)   Resp 16   Wt (!) 347 lb (157.4 kg)   SpO2 96%   BMI 54.35 kg/m  Wt Readings from Last 3 Encounters:  01/03/18 (!) 347 lb (157.4 kg)  08/30/17 (!) 334 lb (151.5 kg)  12/21/16 (!) 336 lb (152.4 kg)     Lab Results  Component Value Date   WBC 8.8 09/14/2016   HGB 13.4 09/14/2016   HCT 39.7 09/14/2016   PLT 261 09/14/2016   GLUCOSE 129 (H) 09/30/2017   CHOL 147 09/30/2017   TRIG 116 09/30/2017   HDL 36 (L) 09/30/2017   LDLCALC 88 09/30/2017   ALT 21 09/30/2017   AST 17 09/30/2017   NA 140 09/30/2017   K 4.8 09/30/2017   CL 100 09/30/2017   CREATININE 0.74 09/30/2017   BUN 12 09/30/2017   CO2 26 09/30/2017   TSH 1.040 09/30/2017   HGBA1C 5.7 (H) 09/30/2017    Mm Screening Breast Tomo Bilateral  Result Date: 11/17/2016 CLINICAL DATA:  Screening. EXAM: 2D DIGITAL SCREENING BILATERAL MAMMOGRAM WITH CAD AND ADJUNCT TOMO COMPARISON:  Previous exam(s). ACR Breast Density Category b: There are scattered areas of  fibroglandular density. FINDINGS: There are no findings suspicious for malignancy. Images were processed with CAD. IMPRESSION: No mammographic evidence of malignancy. A result letter of this screening mammogram will be mailed directly to the patient. RECOMMENDATION: Screening mammogram in one year. (Code:SM-B-01Y) BI-RADS CATEGORY  1: Negative. Electronically Signed   By: Ammie Ferrier M.D.   On: 11/17/2016 11:22       Assessment & Plan:   Problem List Items Addressed This Visit    BMI 50.0-59.9, adult ([place])    Discussed diet and exercise.  Follow.        Essential hypertension, benign    Blood pressure under good control.  Continue  same medication regimen.  Follow pressures.  Follow metabolic panel.        Relevant Orders   CBC with Differential/Platelet   Basic metabolic panel   Fatty infiltration of liver    Continue diet and exercise.  Weight loss.  Follow liver function tests.        Hypercholesterolemia    On crestor.  Low cholesterol diet and exercise.  Follow lipid panel and liver function tests.        Relevant Orders   Hepatic function panel   Lipid panel   Hyperglycemia    Low carb diet and exercise.  Follow met b and a1c.        Relevant Orders   Hemoglobin A1c   Hypothyroidism (acquired)    On thyroid replacement.  Follow tsh.       Relevant Orders   TSH   Stress    Increased stress as outlined.  Discussed treatment options today.  Will increase zoloft to 125m q day.  Schedule f/u.  Notify me if problems.         Other Visit Diagnoses    Breast cancer screening    -  Primary   Relevant Orders   MM DIGITAL SCREENING BILATERAL   Bad odor of urine       check urinalysis and culture to confirm no infection.    Relevant Orders   Urinalysis, Routine w reflex microscopic   Urine Culture       SEinar Pheasant MD

## 2018-01-03 NOTE — Assessment & Plan Note (Signed)
On thyroid replacement.  Follow tsh.  

## 2018-01-03 NOTE — Assessment & Plan Note (Signed)
Discussed diet and exercise.  Follow.  

## 2018-01-03 NOTE — Assessment & Plan Note (Signed)
Blood pressure under good control.  Continue same medication regimen.  Follow pressures.  Follow metabolic panel.   

## 2018-01-14 LAB — BASIC METABOLIC PANEL
BUN/Creatinine Ratio: 18 (ref 9–23)
BUN: 13 mg/dL (ref 6–24)
CO2: 25 mmol/L (ref 20–29)
CREATININE: 0.71 mg/dL (ref 0.57–1.00)
Calcium: 8.8 mg/dL (ref 8.7–10.2)
Chloride: 102 mmol/L (ref 96–106)
GFR calc Af Amer: 121 mL/min/{1.73_m2} (ref >59)
GFR calc non Af Amer: 105 mL/min/{1.73_m2} (ref >59)
GLUCOSE: 127 mg/dL — AB (ref 65–99)
Potassium: 4.5 mmol/L (ref 3.5–5.2)
Sodium: 139 mmol/L (ref 134–144)

## 2018-01-14 LAB — HEPATIC FUNCTION PANEL
ALBUMIN: 3.9 g/dL (ref 3.5–5.5)
ALK PHOS: 94 IU/L (ref 39–117)
ALT: 29 IU/L (ref 0–32)
AST: 17 IU/L (ref 0–40)
BILIRUBIN, DIRECT: 0.13 mg/dL (ref 0.00–0.40)
Bilirubin Total: 0.5 mg/dL (ref 0.0–1.2)
TOTAL PROTEIN: 7 g/dL (ref 6.0–8.5)

## 2018-01-14 LAB — CBC WITH DIFFERENTIAL/PLATELET
BASOS: 0 %
Basophils Absolute: 0 10*3/uL (ref 0.0–0.2)
EOS (ABSOLUTE): 0.2 10*3/uL (ref 0.0–0.4)
EOS: 2 %
HEMATOCRIT: 39.7 % (ref 34.0–46.6)
HEMOGLOBIN: 13.1 g/dL (ref 11.1–15.9)
IMMATURE GRANS (ABS): 0 10*3/uL (ref 0.0–0.1)
IMMATURE GRANULOCYTES: 0 %
LYMPHS: 27 %
Lymphocytes Absolute: 1.9 10*3/uL (ref 0.7–3.1)
MCH: 29.9 pg (ref 26.6–33.0)
MCHC: 33 g/dL (ref 31.5–35.7)
MCV: 91 fL (ref 79–97)
Monocytes Absolute: 0.4 10*3/uL (ref 0.1–0.9)
Monocytes: 6 %
NEUTROS PCT: 65 %
Neutrophils Absolute: 4.6 10*3/uL (ref 1.4–7.0)
Platelets: 201 10*3/uL (ref 150–379)
RBC: 4.38 x10E6/uL (ref 3.77–5.28)
RDW: 13.9 % (ref 12.3–15.4)
WBC: 7.1 10*3/uL (ref 3.4–10.8)

## 2018-01-14 LAB — URINALYSIS, ROUTINE W REFLEX MICROSCOPIC
Bilirubin, UA: NEGATIVE
Glucose, UA: NEGATIVE
KETONES UA: NEGATIVE
Leukocytes, UA: NEGATIVE
Nitrite, UA: NEGATIVE
PH UA: 6 (ref 5.0–7.5)
Protein, UA: NEGATIVE
RBC, UA: NEGATIVE
SPEC GRAV UA: 1.025 (ref 1.005–1.030)
Urobilinogen, Ur: 0.2 mg/dL (ref 0.2–1.0)

## 2018-01-14 LAB — LIPID PANEL
CHOL/HDL RATIO: 4.4 ratio (ref 0.0–4.4)
Cholesterol, Total: 149 mg/dL (ref 100–199)
HDL: 34 mg/dL — ABNORMAL LOW (ref >39)
LDL Calculated: 92 mg/dL (ref 0–99)
TRIGLYCERIDES: 117 mg/dL (ref 0–149)
VLDL Cholesterol Cal: 23 mg/dL (ref 5–40)

## 2018-01-14 LAB — HEMOGLOBIN A1C
Est. average glucose Bld gHb Est-mCnc: 120 mg/dL
Hgb A1c MFr Bld: 5.8 % — ABNORMAL HIGH (ref 4.8–5.6)

## 2018-01-14 LAB — TSH: TSH: 2.65 u[IU]/mL (ref 0.450–4.500)

## 2018-01-15 LAB — URINE CULTURE

## 2018-01-16 ENCOUNTER — Encounter: Payer: Self-pay | Admitting: Internal Medicine

## 2018-01-19 ENCOUNTER — Ambulatory Visit
Admission: RE | Admit: 2018-01-19 | Discharge: 2018-01-19 | Disposition: A | Discharge status: Home / Self Care | Payer: Managed Care, Other (non HMO) | Source: Ambulatory Visit | Attending: Internal Medicine | Admitting: Internal Medicine

## 2018-01-19 DIAGNOSIS — Z1231 Encounter for screening mammogram for malignant neoplasm of breast: Secondary | ICD-10-CM | POA: Diagnosis present

## 2018-01-19 DIAGNOSIS — Z1239 Encounter for other screening for malignant neoplasm of breast: Secondary | ICD-10-CM

## 2018-01-23 ENCOUNTER — Telehealth: Payer: Self-pay | Admitting: Internal Medicine

## 2018-01-23 ENCOUNTER — Ambulatory Visit: Payer: Managed Care, Other (non HMO) | Admitting: Family Medicine

## 2018-01-23 ENCOUNTER — Other Ambulatory Visit: Payer: Self-pay | Admitting: *Deleted

## 2018-01-23 ENCOUNTER — Encounter: Payer: Self-pay | Admitting: Family Medicine

## 2018-01-23 DIAGNOSIS — H60332 Swimmer's ear, left ear: Secondary | ICD-10-CM

## 2018-01-23 DIAGNOSIS — H6092 Unspecified otitis externa, left ear: Secondary | ICD-10-CM | POA: Insufficient documentation

## 2018-01-23 MED ORDER — CIPROFLOXACIN-HYDROCORTISONE 0.2-1 % OT SUSP: 3.0000 [drp] | Freq: Two times a day (BID) | OTIC | 0 refills | Status: DC

## 2018-01-23 NOTE — Assessment & Plan Note (Addendum)
New issue.  Exam and history most consistent with otitis externa.  Intact TM on exam.  No signs of otitis media.  The otitis externa appears to be moderate in nature.  Will treat with ciprofloxacin hydrocortisone otic suspension.  She can use ibuprofen for discomfort.  Will recheck in 1 week.  Discussed return precautions.

## 2018-01-23 NOTE — Telephone Encounter (Signed)
Did you call this in already? Did you inform patient? It states it was sent in by [facility]

## 2018-01-23 NOTE — Telephone Encounter (Signed)
Copied from [place] [phone]. Topic: Quick Communication - Rx Refill/Question >> Jan 23, 2018 12:46 PM Scherrie Gerlach wrote: Medication: ciprofloxacin-hydrocortisone (CIPRO HC OTIC) OTIC suspension  Ph[id number] calling to advise they need this Rx [facility].  They accidentally deleted when they tried to transfer to a [facility] that has this in stock. Please e scribe to  [id number] [address], [place] Fax   [phone]

## 2018-01-23 NOTE — Telephone Encounter (Signed)
noted 

## 2018-01-23 NOTE — Progress Notes (Unsigned)
Medication was deleted at Pharmacy by mistake recent script.

## 2018-01-23 NOTE — Telephone Encounter (Signed)
Yes I did and I sent my chart message advising patient I received the request through My chart.

## 2018-01-23 NOTE — Telephone Encounter (Signed)
Pt called back to check on status of the ear drop rx that Dr Caryl Bis prescribed her today that was accidentally deleted.

## 2018-01-23 NOTE — Progress Notes (Signed)
  Tommi Rumps, MD Phone: [phone]  Jenna Winters is a 42 y.o. female who presents today for same-day visit.  Patient notes for the last 3 days she has had left ear symptoms.  Notes it started with aching just anterior to her left ear and then she developed left ear fullness and pressure sensation.  She notes a very minimal amount of tinnitus.  Does note some muffled hearing.  No vertigo.  Notes she tried some earache drops over-the-counter that helped some though symptoms worsened again today.  Notes a crackling sensation in her ear.  Not able to get it to pop.  Some soreness in the preauricular area with this.  No congestion, fevers, or rhinorrhea.  No trouble swallowing.  She is been eating and drinking well.  Social History   Tobacco Use  Smoking Status Never Smoker  Smokeless Tobacco Never Used     ROS see history of present illness  Objective  Physical Exam Vitals:   01/23/18 1110  BP: 110/80  Pulse: 74  Temp: 98 F (36.7 C)  SpO2: 98%    BP Readings from Last 3 Encounters:  01/23/18 110/80  01/03/18 118/76  08/30/17 110/70   Wt Readings from Last 3 Encounters:  01/23/18 (!) 346 lb 3.2 oz (157 kg)  01/03/18 (!) 347 lb (157.4 kg)  08/30/17 (!) 334 lb (151.5 kg)    Physical Exam  Constitutional: No distress.  HENT:  Head: Normocephalic and atraumatic.  Mouth/Throat: Oropharynx is clear and moist. No oropharyngeal exudate.  Left ear canal with edema and mild erythema with discomfort on moving the external ear, the ear canal is not completely occluded, no drainage, normal TM noted, tenderness in the preauricular area with possible palpable lymphadenopathy in the preauricular area, no overlying erythema, no mastoid process tenderness or erythema, right ear canal normal with normal TM, no cervical adenopathy  Eyes: Conjunctivae are normal. Pupils are equal, round, and reactive to light.  Neck: Neck supple.  Cardiovascular: Normal rate, regular rhythm and  normal heart sounds.  Pulmonary/Chest: Effort normal and breath sounds normal.  Musculoskeletal: She exhibits no edema.  Lymphadenopathy:    She has no cervical adenopathy.  Neurological: She is alert. Gait normal.  Skin: Skin is warm and dry. She is not diaphoretic.     Assessment/Plan: Please see individual problem list.  Otitis externa of left ear New issue.  Exam and history most consistent with otitis externa.  Intact TM on exam.  No signs of otitis media.  The otitis externa appears to be moderate in nature.  Will treat with ciprofloxacin hydrocortisone otic suspension.  She can use ibuprofen for discomfort.  Will recheck in 1 week.  Discussed return precautions.   No orders of the defined types were placed in this encounter.   Meds ordered this encounter  Medications  . ciprofloxacin-hydrocortisone (CIPRO HC OTIC) OTIC suspension    Sig: Place 3 drops into the left ear 2 (two) times daily. For 7 days    Dispense:  10 mL    Refill:  0     Tommi Rumps, MD [place]

## 2018-01-23 NOTE — Patient Instructions (Signed)
Nice to see you. You likely have otitis externa. We will treat you with ciprofloxacin hydrocortisone eardrops to be applied to your left ear as prescribed. You can take ibuprofen 600 mg every 6 hours as needed for the pain.  You should take this with food. If you develop fevers, worsening discomfort, or any new or changing symptoms please be reevaluated.  We will see you back in 1 week.

## 2018-01-30 ENCOUNTER — Ambulatory Visit: Payer: Managed Care, Other (non HMO) | Admitting: Family Medicine

## 2018-01-30 ENCOUNTER — Encounter: Payer: Self-pay | Admitting: Family Medicine

## 2018-01-30 DIAGNOSIS — H60332 Swimmer's ear, left ear: Secondary | ICD-10-CM | POA: Diagnosis not present

## 2018-01-30 NOTE — Patient Instructions (Signed)
Nice to see you. I am glad your symptoms have improved.  Please finish the total course of eardrops to be completed tonight. If your symptoms return please let us know. You can try Flonase nasal spray to see if that helps with the crackling in your ears

## 2018-01-30 NOTE — Assessment & Plan Note (Signed)
Improved.  She will finish the course of eardrops tonight.  She will monitor for recurrence.  She can use Flonase to see if that helps with possible eustachian tube dysfunction.  She will continue her allergy pill.

## 2018-01-30 NOTE — Progress Notes (Signed)
  Tommi Rumps, MD Phone: [phone]  Jenna Winters is a 42 y.o. female who presents today for follow-up.  Patient seen previously for otitis externa.  She has been using ciprofloxacin and hydrocortisone eardrops with good benefit.  The pain is resolved.  No further tinnitus.  Her last dose is tonight.  Notes a little crackling if she moves her jaw a certain way that is improving as well.  Social History   Tobacco Use  Smoking Status Never Smoker  Smokeless Tobacco Never Used     ROS see history of present illness  Objective  Physical Exam Vitals:   01/30/18 1114  BP: 100/72  Pulse: 67  Temp: 98.1 F (36.7 C)  SpO2: 96%    BP Readings from Last 3 Encounters:  01/30/18 100/72  01/23/18 110/80  01/03/18 118/76   Wt Readings from Last 3 Encounters:  01/30/18 (!) 351 lb 6.4 oz (159.4 kg)  01/23/18 (!) 346 lb 3.2 oz (157 kg)  01/03/18 (!) 347 lb (157.4 kg)    Physical Exam  Constitutional: No distress.  HENT:  Head: Normocephalic and atraumatic.  Left ear canal with no swelling or drainage, TM appears normal, no tenderness of the preauricular area, no erythema, no discomfort on pulling of the external ear, right ear canal and TM normal  Cardiovascular: Normal rate and regular rhythm.  Skin: She is not diaphoretic.     Assessment/Plan: Please see individual problem list.  Otitis externa of left ear Improved.  She will finish the course of eardrops tonight.  She will monitor for recurrence.  She can use Flonase to see if that helps with possible eustachian tube dysfunction.  She will continue her allergy pill.   No orders of the defined types were placed in this encounter.   No orders of the defined types were placed in this encounter.    Tommi Rumps, MD [place]

## 2018-04-08 ENCOUNTER — Other Ambulatory Visit: Payer: Self-pay | Admitting: Internal Medicine

## 2018-04-19 ENCOUNTER — Encounter: Payer: Managed Care, Other (non HMO) | Admitting: Internal Medicine

## 2018-04-21 ENCOUNTER — Other Ambulatory Visit: Payer: Self-pay | Admitting: Internal Medicine

## 2018-04-23 ENCOUNTER — Other Ambulatory Visit: Payer: Self-pay | Admitting: Internal Medicine

## 2018-06-13 ENCOUNTER — Encounter: Payer: Self-pay | Admitting: Family Medicine

## 2018-06-13 ENCOUNTER — Ambulatory Visit: Payer: Managed Care, Other (non HMO) | Admitting: Family Medicine

## 2018-06-13 VITALS — BP 110/60 | HR 83 | Temp 98.9°F | Wt 363.2 lb

## 2018-06-13 DIAGNOSIS — H60502 Unspecified acute noninfective otitis externa, left ear: Secondary | ICD-10-CM

## 2018-06-13 MED ORDER — CIPROFLOXACIN-HYDROCORTISONE 0.2-1 % OT SUSP: OTIC | 0 refills | Status: DC

## 2018-06-13 NOTE — Progress Notes (Signed)
Subjective:    Patient ID: Jenna Winters, female    DOB: 1975-12-12, 42 y.o.   MRN: [id number]  HPI  Jenna Winters is a 42 year old female who presents today with left ear pain x 4 days.   Ear Pain: Yes, noted as discomfort Tinnitus: No Decreased hearing: Yes, described as muffled Vertigo: No Fever: No Rhinorrhea: No Sinus pressure/pain: No Associated symptom of "popping sound" in ear. She states that she cannot get her ear to "pop" No issues with eating/drinking. Treatment: Allegra has provided excellent benefit for allergic rhinitis symptoms.  She was seen on 01/23/18 for swimmer's ear and was noted to have acute swimmer's ear. She was treated with Cipro drops which provided excellent benefit. She denies any recent swimming stating she has been in the pool this summer approximately 3 times.   Review of Systems  Constitutional: Negative for chills, fatigue and fever.  HENT: Positive for ear pain. Negative for ear discharge, postnasal drip, rhinorrhea, sinus pressure, sinus pain, sore throat, tinnitus and trouble swallowing.   Respiratory: Negative for cough, shortness of breath and wheezing.   Cardiovascular: Negative for chest pain and palpitations.  Gastrointestinal: Negative for abdominal pain.  Skin: Negative for rash.  Neurological: Negative for dizziness, light-headedness and headaches.       Past Medical History:  Diagnosis Date  . Depression   . Hemorrhoid   . Hypertension   . Hyperthyroidism 2009   s/p ablation with resulting hypothyroidism     Social History   Socioeconomic History  . Marital status: Divorced    Spouse name: Not on file  . Number of children: 1  . Years of education: Not on file  . Highest education level: Not on file  Occupational History  . Occupation: Buyer, retail: [employer]  Social Needs  . Financial resource strain: Not on file  . Food insecurity:    Worry: Not on file    Inability: Not on file  . Transportation  needs:    Medical: Not on file    Non-medical: Not on file  Tobacco Use  . Smoking status: Never Smoker  . Smokeless tobacco: Never Used  Substance and Sexual Activity  . Alcohol use: Yes    Alcohol/week: 0.0 oz    Comment: rare occasion  . Drug use: No  . Sexual activity: Not on file  Lifestyle  . Physical activity:    Days per week: Not on file    Minutes per session: Not on file  . Stress: Not on file  Relationships  . Social connections:    Talks on phone: Not on file    Gets together: Not on file    Attends religious service: Not on file    Active member of club or organization: Not on file    Attends meetings of clubs or organizations: Not on file    Relationship status: Not on file  . Intimate partner violence:    Fear of current or ex partner: Not on file    Emotionally abused: Not on file    Physically abused: Not on file    Forced sexual activity: Not on file  Other Topics Concern  . Not on file  Social History Narrative  . Not on file    Past Surgical History:  Procedure Laterality Date  . ABDOMINAL HYSTERECTOMY  08/2011  . BREAST BIOPSY Left 2015   core bx fibroadenoma  . COLONOSCOPY    . DILATION AND CURETTAGE OF  UTERUS  12/2010  . DILATION AND CURETTAGE OF UTERUS  1995  . HEMORRHOID SURGERY  08/2013    Family History  Problem Relation Age of Onset  . Hyperlipidemia Father   . Hypertension Father   . Diabetes Father   . Hyperlipidemia Paternal Grandmother   . Hypertension Paternal Grandmother   . Diabetes Paternal Grandmother   . Hyperlipidemia Paternal Grandfather   . Hypertension Paternal Grandfather   . Diabetes Paternal Grandfather   . Lung cancer Paternal Grandfather   . COPD Paternal Grandfather        lung  . Breast cancer Neg Hx     No Known Allergies  Current Outpatient Medications on File Prior to Visit  Medication Sig Dispense Refill  . cholecalciferol (VITAMIN D) 1000 UNITS tablet Take 1,000 Units by mouth daily.    Marland Kitchen  levothyroxine (SYNTHROID, LEVOTHROID) 175 MCG tablet TAKE 1 TABLET BY MOUTH  DAILY BEFORE BREAKFAST 90 tablet 1  . lisinopril-hydrochlorothiazide (PRINZIDE,ZESTORETIC) 20-25 MG tablet TAKE 1 TABLET BY MOUTH  DAILY 90 tablet 1  . metoprolol succinate (TOPROL-XL) 50 MG 24 hr tablet TAKE 1 TABLET BY MOUTH 2  TIMES DAILY. TAKE WITH OR  IMMEDIATELY FOLLOWING  MEAL(S). 180 tablet 1  . rosuvastatin (CRESTOR) 5 MG tablet TAKE 1 TABLET BY MOUTH  DAILY 90 tablet 1  . sertraline (ZOLOFT) 100 MG tablet Take 1 tablet (100 mg total) by mouth daily. 90 tablet 1  . triamcinolone cream (KENALOG) 0.1 % Apply 1 application topically 2 (two) times daily. 30 g 1   No current facility-administered medications on file prior to visit.     BP 110/60 (BP Location: Left Arm, Patient Position: Sitting, Cuff Size: Large)   Pulse 83   Temp 98.9 F (37.2 C) (Oral)   Wt (!) 363 lb 4 oz (164.8 kg)   SpO2 97%   BMI 56.89 kg/m    Objective:   Physical Exam  Constitutional: She appears well-developed and well-nourished.  HENT:  Nose: No rhinorrhea. Right sinus exhibits no maxillary sinus tenderness and no frontal sinus tenderness. Left sinus exhibits no maxillary sinus tenderness and no frontal sinus tenderness.  Mouth/Throat: Oropharynx is clear and moist and mucous membranes are normal.  Mild edema and erythema of left ear canal. Left TM intact. Discomfort with movement of external ear. Mild tenderness in preauricular area. No mastoid tenderness/erythema. No cervical adenopathy.  Right TM and canal: WNL  Eyes: Pupils are equal, round, and reactive to light. No scleral icterus.  Neck: Neck supple.  Cardiovascular: Normal rate and regular rhythm.  Pulmonary/Chest: Effort normal and breath sounds normal. She has no wheezes. She has no rales.  Lymphadenopathy:    She has no cervical adenopathy.  Skin: Skin is warm and dry. No erythema.  Psychiatric: She has a normal mood and affect. Her behavior is normal. Judgment  and thought content normal.       Assessment & Plan:  1. Acute otitis externa of left ear, unspecified type Exam is most consistent with otitis externa. She was treated previously on 01/23/18 for similar presentation and improved with use of Cipro HC otic. Intact TM; no signs of AOM. Cipro HC otic and advised short course of ibuprofen with food for discomfort. Advised that she follow up in one week for a recheck. Discussed conservative measures to avoid water in her ear. Return precautions provided.   - ciprofloxacin-hydrocortisone (CIPRO HC) OTIC suspension; Please 3 drops in left ear two times daily for 7 days.  Dispense:  10 mL; Refill: 0  Delano Metz, FNP-C

## 2018-06-13 NOTE — Patient Instructions (Addendum)
Please use drops in left ear  as directed and follow up in one week for reevaluation of ear. You most likely have otitis externa known as "swimmer's ear". You can use ibuprofen 600 mg every 6 hours with food as needed for discomfort during treatment. If symptoms worsen, or you develop any new symptoms or fever, please seek care.  Follow up in one week.    Otitis Externa Otitis externa is an infection of the outer ear canal. The outer ear canal is the area between the outside of the ear and the eardrum. Otitis externa is sometimes called "swimmer's ear." Follow these instructions at home:  If you were given antibiotic ear drops, use them as told by your doctor. Do not stop using them even if your condition gets better.  Take over-the-counter and prescription medicines only as told by your doctor.  Keep all follow-up visits as told by your doctor. This is important. How is this prevented?  Keep your ear dry. Use the corner of a towel to dry your ear after you swim or bathe.  Try not to scratch or put things in your ear. Doing these things makes it easier for germs to grow in your ear.  Avoid swimming in lakes, dirty water, or pools that may not have the right amount of a chemical called chlorine.  Consider making ear drops and putting 3 or 4 drops in each ear after you swim. Ask your doctor about how you can make ear drops. Contact a doctor if:  You have a fever.  After 3 days your ear is still red, swollen, or painful.  After 3 days you still have pus coming from your ear.  Your redness, swelling, or pain gets worse.  You have a really bad headache.  You have redness, swelling, pain, or tenderness behind your ear. This information is not intended to replace advice given to you by your health care provider. Make sure you discuss any questions you have with your health care provider. Document Released: 04/26/2008 Document Revised: 12/04/2015 Document Reviewed:  08/18/2015 Elsevier Interactive Patient Education  [employer].

## 2018-06-19 NOTE — Progress Notes (Deleted)
   Subjective:    Patient ID: Jenna Winters, female    DOB: 1976-03-31, 42 y.o.   MRN: [id number]  HPI  [name] is a 42 year old female who presents today for follow up of otitis externa diagnosed on 06/13/18. She reports feeling  Ear pain: Tinnitus: Decreased hearing: Fever: Rhinorrhea: Sinus Pressure: Associated symptoms: Difficulty eating/drinking: Treatment:   Review of Systems     Objective:   Physical Exam        Assessment & Plan:

## 2018-06-20 ENCOUNTER — Ambulatory Visit: Payer: Managed Care, Other (non HMO) | Admitting: Family Medicine

## 2018-06-20 DIAGNOSIS — Z0289 Encounter for other administrative examinations: Secondary | ICD-10-CM

## 2018-06-27 ENCOUNTER — Encounter: Payer: Self-pay | Admitting: Internal Medicine

## 2018-08-04 ENCOUNTER — Ambulatory Visit: Payer: Managed Care, Other (non HMO) | Admitting: Internal Medicine

## 2018-08-04 VITALS — BP 130/78 | HR 75 | Temp 97.5°F | Resp 18 | Wt 366.2 lb

## 2018-08-04 DIAGNOSIS — F439 Reaction to severe stress, unspecified: Secondary | ICD-10-CM

## 2018-08-04 DIAGNOSIS — E7800 Pure hypercholesterolemia, unspecified: Secondary | ICD-10-CM | POA: Diagnosis not present

## 2018-08-04 DIAGNOSIS — Z23 Encounter for immunization: Secondary | ICD-10-CM | POA: Diagnosis not present

## 2018-08-04 DIAGNOSIS — I10 Essential (primary) hypertension: Secondary | ICD-10-CM

## 2018-08-04 DIAGNOSIS — E039 Hypothyroidism, unspecified: Secondary | ICD-10-CM

## 2018-08-04 DIAGNOSIS — R739 Hyperglycemia, unspecified: Secondary | ICD-10-CM

## 2018-08-04 DIAGNOSIS — K760 Fatty (change of) liver, not elsewhere classified: Secondary | ICD-10-CM | POA: Diagnosis not present

## 2018-08-04 MED ORDER — BUSPIRONE HCL 5 MG PO TABS: 5.0000 mg | ORAL_TABLET | Freq: Two times a day (BID) | ORAL | 1 refills | Status: DC | PRN

## 2018-08-04 MED ORDER — SERTRALINE HCL 100 MG PO TABS: 100.0000 mg | ORAL_TABLET | Freq: Every day | ORAL | 1 refills | Status: DC

## 2018-08-04 NOTE — Progress Notes (Signed)
Patient ID: Joanie Coddington, female   DOB: Feb 24, 1976, 42 y.o.   MRN: [id number]   Subjective:    Patient ID: Joanie Coddington, female    DOB: 1976/11/17, 42 y.o.   MRN: [id number]  HPI  Patient here for a scheduled follow up. She reports increased stress.  Has moved.  Last visit, increased zoloft to 135m q day.  Still feels needs something more.  No depression.  Discussed treatment options.  Tries to stay active.  Has not been exercising regularly.  Has recently joined a gym.  Plans to start exercising more.  Discussed diet adjustment.  No chest pain.  No sob. No acid reflux.  No abdominal pain.  Bowels moving.    Past Medical History:  Diagnosis Date  . Depression   . Hemorrhoid   . Hypertension   . Hyperthyroidism 2009   s/p ablation with resulting hypothyroidism   Past Surgical History:  Procedure Laterality Date  . ABDOMINAL HYSTERECTOMY  08/2011  . BREAST BIOPSY Left 2015   core bx fibroadenoma  . COLONOSCOPY    . DILATION AND CURETTAGE OF UTERUS  12/2010  . DILATION AND CURETTAGE OF UTERUS  1995  . HEMORRHOID SURGERY  08/2013   Family History  Problem Relation Age of Onset  . Hyperlipidemia Father   . Hypertension Father   . Diabetes Father   . Hyperlipidemia Paternal Grandmother   . Hypertension Paternal Grandmother   . Diabetes Paternal Grandmother   . Hyperlipidemia Paternal Grandfather   . Hypertension Paternal Grandfather   . Diabetes Paternal Grandfather   . Lung cancer Paternal Grandfather   . COPD Paternal Grandfather        lung  . Breast cancer Neg Hx    Social History   Socioeconomic History  . Marital status: Divorced    Spouse name: Not on file  . Number of children: 1  . Years of education: Not on file  . Highest education level: Not on file  Occupational History  . Occupation: [employer]  Social Needs  . Financial resource strain: Not on file  . Food insecurity:    Worry: Not on file    Inability: Not on file  .  Transportation needs:    Medical: Not on file    Non-medical: Not on file  Tobacco Use  . Smoking status: Never Smoker  . Smokeless tobacco: Never Used  Substance and Sexual Activity  . Alcohol use: Yes    Alcohol/week: 0.0 standard drinks    Comment: rare occasion  . Drug use: No  . Sexual activity: Not on file  Lifestyle  . Physical activity:    Days per week: Not on file    Minutes per session: Not on file  . Stress: Not on file  Relationships  . Social connections:    Talks on phone: Not on file    Gets together: Not on file    Attends religious service: Not on file    Active member of club or organization: Not on file    Attends meetings of clubs or organizations: Not on file    Relationship status: Not on file  Other Topics Concern  . Not on file  Social History Narrative  . Not on file    Outpatient Encounter Medications as of 08/04/2018  Medication Sig  . cholecalciferol (VITAMIN D) 1000 UNITS tablet Take 1,000 Units by mouth daily.  .Marland Kitchenlevothyroxine (SYNTHROID, LEVOTHROID) 175 MCG tablet TAKE 1 TABLET  BY MOUTH  DAILY BEFORE BREAKFAST  . lisinopril-hydrochlorothiazide (PRINZIDE,ZESTORETIC) 20-25 MG tablet TAKE 1 TABLET BY MOUTH  DAILY  . metoprolol succinate (TOPROL-XL) 50 MG 24 hr tablet TAKE 1 TABLET BY MOUTH 2  TIMES DAILY. TAKE WITH OR  IMMEDIATELY FOLLOWING  MEAL(S).  . rosuvastatin (CRESTOR) 5 MG tablet TAKE 1 TABLET BY MOUTH  DAILY  . sertraline (ZOLOFT) 100 MG tablet Take 1 tablet (100 mg total) by mouth daily.  Marland Kitchen triamcinolone cream (KENALOG) 0.1 % Apply 1 application topically 2 (two) times daily.  . [DISCONTINUED] ciprofloxacin-hydrocortisone (CIPRO HC) OTIC suspension Please 3 drops in left ear two times daily for 7 days.  . [DISCONTINUED] sertraline (ZOLOFT) 100 MG tablet Take 1 tablet (100 mg total) by mouth daily.  . busPIRone (BUSPAR) 5 MG tablet Take 1 tablet (5 mg total) by mouth 2 (two) times daily as needed.   No facility-administered encounter  medications on file as of 08/04/2018.     Review of Systems  Constitutional: Negative for appetite change.       Has gained weight.   HENT: Negative for congestion and sinus pressure.   Respiratory: Negative for cough, chest tightness and shortness of breath.   Cardiovascular: Negative for chest pain, palpitations and leg swelling.  Gastrointestinal: Negative for abdominal pain, diarrhea, nausea and vomiting.  Genitourinary: Negative for difficulty urinating and dysuria.  Musculoskeletal: Negative for joint swelling and myalgias.  Skin: Negative for color change and rash.  Neurological: Negative for dizziness, light-headedness and headaches.  Psychiatric/Behavioral: Negative for agitation and dysphoric mood.       Increased stress as outlined.         Objective:    Physical Exam  Constitutional: She appears well-developed and well-nourished. No distress.  HENT:  Nose: Nose normal.  Mouth/Throat: Oropharynx is clear and moist.  Neck: Neck supple. No thyromegaly present.  Cardiovascular: Normal rate and regular rhythm.  Pulmonary/Chest: Breath sounds normal. No respiratory distress. She has no wheezes.  Abdominal: Soft. Bowel sounds are normal. There is no tenderness.  Musculoskeletal: She exhibits no edema or tenderness.  Lymphadenopathy:    She has no cervical adenopathy.  Skin: No rash noted. No erythema.  Psychiatric: She has a normal mood and affect. Her behavior is normal.    BP 130/78 (BP Location: Left Arm, Patient Position: Sitting, Cuff Size: Large)   Pulse 75   Temp (!) 97.5 F (36.4 C) (Oral)   Resp 18   Wt (!) 366 lb 3.2 oz (166.1 kg)   SpO2 97%   BMI 57.36 kg/m  Wt Readings from Last 3 Encounters:  08/04/18 (!) 366 lb 3.2 oz (166.1 kg)  06/13/18 (!) 363 lb 4 oz (164.8 kg)  01/30/18 (!) 351 lb 6.4 oz (159.4 kg)     Lab Results  Component Value Date   WBC 7.1 01/13/2018   HGB 13.1 01/13/2018   HCT 39.7 01/13/2018   PLT 201 01/13/2018   GLUCOSE 127  (H) 01/13/2018   CHOL 149 01/13/2018   TRIG 117 01/13/2018   HDL 34 (L) 01/13/2018   LDLCALC 92 01/13/2018   ALT 29 01/13/2018   AST 17 01/13/2018   NA 139 01/13/2018   K 4.5 01/13/2018   CL 102 01/13/2018   CREATININE 0.71 01/13/2018   BUN 13 01/13/2018   CO2 25 01/13/2018   TSH 2.650 01/13/2018   HGBA1C 5.8 (H) 01/13/2018    Mm Screening Breast Tomo Bilateral  Result Date: 01/19/2018 CLINICAL DATA:  Screening. EXAM: DIGITAL SCREENING  BILATERAL MAMMOGRAM WITH TOMO AND CAD COMPARISON:  Previous exam(s). ACR Breast Density Category b: There are scattered areas of fibroglandular density. FINDINGS: There are no findings suspicious for malignancy. Images were processed with CAD. IMPRESSION: No mammographic evidence of malignancy. A result letter of this screening mammogram will be mailed directly to the patient. RECOMMENDATION: Screening mammogram in one year. (Code:SM-B-01Y) BI-RADS CATEGORY  1: Negative. Electronically Signed   By: Claudie Revering M.D.   On: 01/19/2018 10:25       Assessment & Plan:   Problem List Items Addressed This Visit    Essential hypertension, benign    Blood pressure under good control.  Continue same medication regimen.  Follow pressures.  Follow metabolic panel.        Relevant Orders   CBC with Differential/Platelet   Basic metabolic panel   Fatty infiltration of liver    Discussed diet, exercise and weight loss.  Follow liver panel.        Hypercholesterolemia    On crestor.  Low cholesterol diet and exercise.  Follow lipid panel and liver function tests.        Relevant Orders   Hepatic function panel   Lipid panel   Hyperglycemia    Low carb diet and exercise.  Follow met b and a1c.        Relevant Orders   Hemoglobin A1c   Hypothyroidism (acquired)    On thyroid replacement.  Follow tsh.        Relevant Orders   TSH   VITAMIN D 25 Hydroxy (Vit-D Deficiency, Fractures)   Stress    Increased stress as outlined.  Discussed with her  today.  She does not feel needs referral to counselor or psychiatry.  On zoloft 153m q day.  zoloft has worked well for her previously.  Feel just need something more.  Discussed treatment options.  Discussed adding buspar.  She was in agreement.  Get her back in soon to reassess.         Other Visit Diagnoses    Need for influenza vaccination    -  Primary   Relevant Orders   Flu Vaccine QUAD 6+ mos PF IM (Fluarix Quad PF) (Completed)       CEinar Pheasant MD

## 2018-08-07 ENCOUNTER — Encounter: Payer: Self-pay | Admitting: Internal Medicine

## 2018-08-07 NOTE — Assessment & Plan Note (Signed)
On thyroid replacement.  Follow tsh.  

## 2018-08-07 NOTE — Assessment & Plan Note (Signed)
Blood pressure under good control.  Continue same medication regimen.  Follow pressures.  Follow metabolic panel.   

## 2018-08-07 NOTE — Assessment & Plan Note (Signed)
On crestor.  Low cholesterol diet and exercise.  Follow lipid panel and liver function tests.   

## 2018-08-07 NOTE — Assessment & Plan Note (Signed)
Increased stress as outlined.  Discussed with her today.  She does not feel needs referral to counselor or psychiatry.  On zoloft 100mg  q day.  zoloft has worked well for her previously.  Feel just need something more.  Discussed treatment options.  Discussed adding buspar.  She was in agreement.  Get her back in soon to reassess.

## 2018-08-07 NOTE — Assessment & Plan Note (Signed)
Discussed diet, exercise and weight loss.  Follow liver panel.   

## 2018-08-07 NOTE — Assessment & Plan Note (Signed)
Low carb diet and exercise.  Follow met b and a1c.   

## 2018-08-09 ENCOUNTER — Encounter: Payer: Self-pay | Admitting: Internal Medicine

## 2018-08-10 MED ORDER — METOPROLOL SUCCINATE ER 50 MG PO TB24: ORAL_TABLET | ORAL | 1 refills | Status: DC

## 2018-08-10 MED ORDER — METOPROLOL SUCCINATE ER 50 MG PO TB24: ORAL_TABLET | ORAL | 0 refills | Status: DC

## 2018-08-10 NOTE — Telephone Encounter (Signed)
rx sent in for toprol #60 with no refills to [facility] and #180 with one refill to [facility].

## 2018-08-23 ENCOUNTER — Encounter: Payer: Self-pay | Admitting: Internal Medicine

## 2018-08-23 ENCOUNTER — Other Ambulatory Visit: Payer: Self-pay

## 2018-08-23 ENCOUNTER — Other Ambulatory Visit: Payer: Self-pay | Admitting: Internal Medicine

## 2018-08-23 MED ORDER — SERTRALINE HCL 100 MG PO TABS: 100.0000 mg | ORAL_TABLET | Freq: Every day | ORAL | 1 refills | Status: DC

## 2018-10-02 ENCOUNTER — Ambulatory Visit (INDEPENDENT_AMBULATORY_CARE_PROVIDER_SITE_OTHER): Payer: Managed Care, Other (non HMO) | Admitting: Internal Medicine

## 2018-10-02 ENCOUNTER — Other Ambulatory Visit: Payer: Self-pay | Admitting: Internal Medicine

## 2018-10-02 ENCOUNTER — Encounter: Payer: Self-pay | Admitting: Internal Medicine

## 2018-10-02 VITALS — BP 126/78 | HR 78 | Temp 97.5°F | Resp 18 | Ht 67.0 in | Wt 365.8 lb

## 2018-10-02 DIAGNOSIS — R739 Hyperglycemia, unspecified: Secondary | ICD-10-CM

## 2018-10-02 DIAGNOSIS — N898 Other specified noninflammatory disorders of vagina: Secondary | ICD-10-CM

## 2018-10-02 DIAGNOSIS — Z124 Encounter for screening for malignant neoplasm of cervix: Secondary | ICD-10-CM | POA: Diagnosis not present

## 2018-10-02 DIAGNOSIS — Z0000 Encounter for general adult medical examination without abnormal findings: Secondary | ICD-10-CM | POA: Diagnosis not present

## 2018-10-02 DIAGNOSIS — F439 Reaction to severe stress, unspecified: Secondary | ICD-10-CM

## 2018-10-02 DIAGNOSIS — I10 Essential (primary) hypertension: Secondary | ICD-10-CM

## 2018-10-02 DIAGNOSIS — E039 Hypothyroidism, unspecified: Secondary | ICD-10-CM

## 2018-10-02 DIAGNOSIS — K760 Fatty (change of) liver, not elsewhere classified: Secondary | ICD-10-CM

## 2018-10-02 DIAGNOSIS — E7800 Pure hypercholesterolemia, unspecified: Secondary | ICD-10-CM

## 2018-10-02 NOTE — Progress Notes (Signed)
Patient ID: Jenna Winters, female   DOB: 1976/04/24, 42 y.o.   MRN: [id number]   Subjective:    Patient ID: Jenna Winters, female    DOB: 01/18/1976, 42 y.o.   MRN: [id number]  HPI  Patient here for her physical exam. She reports she is doing relatively well.  Recent move.  Adjusting well.  Work going well.  No chest pain.  No sob.  No acid reflux.  No abdominal pain.  Bowels moving.  Some right arm pain and right shoulder pain.  Good rom.  Desires no further evaluation at this time.  Handling stress.     Past Medical History:  Diagnosis Date  . Depression   . Hemorrhoid   . Hypertension   . Hyperthyroidism 2009   s/p ablation with resulting hypothyroidism   Past Surgical History:  Procedure Laterality Date  . ABDOMINAL HYSTERECTOMY  08/2011  . BREAST BIOPSY Left 2015   core bx fibroadenoma  . COLONOSCOPY    . DILATION AND CURETTAGE OF UTERUS  12/2010  . DILATION AND CURETTAGE OF UTERUS  1995  . HEMORRHOID SURGERY  08/2013   Family History  Problem Relation Age of Onset  . Hyperlipidemia Father   . Hypertension Father   . Diabetes Father   . Hyperlipidemia Paternal Grandmother   . Hypertension Paternal Grandmother   . Diabetes Paternal Grandmother   . Hyperlipidemia Paternal Grandfather   . Hypertension Paternal Grandfather   . Diabetes Paternal Grandfather   . Lung cancer Paternal Grandfather   . COPD Paternal Grandfather        lung  . Breast cancer Neg Hx    Social History   Socioeconomic History  . Marital status: Divorced    Spouse name: Not on file  . Number of children: 1  . Years of education: Not on file  . Highest education level: Not on file  Occupational History  . Occupation: Buyer, retail: [employer]  Social Needs  . Financial resource strain: Not on file  . Food insecurity:    Worry: Not on file    Inability: Not on file  . Transportation needs:    Medical: Not on file    Non-medical: Not on file  Tobacco Use  . Smoking status:  Never Smoker  . Smokeless tobacco: Never Used  Substance and Sexual Activity  . Alcohol use: Yes    Alcohol/week: 0.0 standard drinks    Comment: rare occasion  . Drug use: No  . Sexual activity: Not on file  Lifestyle  . Physical activity:    Days per week: Not on file    Minutes per session: Not on file  . Stress: Not on file  Relationships  . Social connections:    Talks on phone: Not on file    Gets together: Not on file    Attends religious service: Not on file    Active member of club or organization: Not on file    Attends meetings of clubs or organizations: Not on file    Relationship status: Not on file  Other Topics Concern  . Not on file  Social History Narrative  . Not on file    Outpatient Encounter Medications as of 10/02/2018  Medication Sig  . busPIRone (BUSPAR) 5 MG tablet Take 1 tablet (5 mg total) by mouth 2 (two) times daily as needed.  . cholecalciferol (VITAMIN D) 1000 UNITS tablet Take 1,000 Units by mouth daily.  Marland Kitchen levothyroxine (SYNTHROID, LEVOTHROID)  175 MCG tablet TAKE 1 TABLET BY MOUTH  DAILY BEFORE BREAKFAST  . lisinopril-hydrochlorothiazide (PRINZIDE,ZESTORETIC) 20-25 MG tablet TAKE 1 TABLET BY MOUTH  DAILY  . metoprolol succinate (TOPROL-XL) 50 MG 24 hr tablet TAKE 1 TABLET BY MOUTH 2  TIMES DAILY. TAKE WITH OR  IMMEDIATELY FOLLOWING  MEAL(S).  . rosuvastatin (CRESTOR) 5 MG tablet TAKE 1 TABLET BY MOUTH  DAILY  . sertraline (ZOLOFT) 100 MG tablet Take 1 tablet (100 mg total) by mouth daily.  Marland Kitchen triamcinolone cream (KENALOG) 0.1 % Apply 1 application topically 2 (two) times daily.   No facility-administered encounter medications on file as of 10/02/2018.     Review of Systems  Constitutional: Negative for appetite change and unexpected weight change.  HENT: Negative for congestion and sinus pressure.   Eyes: Negative for pain and visual disturbance.  Respiratory: Negative for cough, chest tightness and shortness of breath.   Cardiovascular:  Negative for chest pain, palpitations and leg swelling.  Gastrointestinal: Negative for abdominal pain, diarrhea, nausea and vomiting.  Genitourinary: Negative for difficulty urinating and dysuria.  Musculoskeletal: Negative for joint swelling and myalgias.  Skin: Negative for color change and rash.  Neurological: Negative for dizziness, light-headedness and headaches.  Hematological: Negative for adenopathy. Does not bruise/bleed easily.  Psychiatric/Behavioral: Negative for agitation and dysphoric mood.       Objective:    Physical Exam  Constitutional: She is oriented to person, place, and time. She appears well-developed and well-nourished. No distress.  HENT:  Nose: Nose normal.  Mouth/Throat: Oropharynx is clear and moist.  Eyes: Right eye exhibits no discharge. Left eye exhibits no discharge. No scleral icterus.  Neck: Neck supple. No thyromegaly present.  Cardiovascular: Normal rate and regular rhythm.  Pulmonary/Chest: Breath sounds normal. No accessory muscle usage. No tachypnea. No respiratory distress. She has no decreased breath sounds. She has no wheezes. She has no rhonchi. Right breast exhibits no inverted nipple, no mass, no nipple discharge and no tenderness (no axillary adenopathy). Left breast exhibits no inverted nipple, no mass, no nipple discharge and no tenderness (no axilarry adenopathy).  Abdominal: Soft. Bowel sounds are normal. There is no tenderness.  Genitourinary:  Genitourinary Comments: Normal external genitalia.  Vaginal vault without lesions.  Pap smear performed.  Could not appreciate any adnexal masses or tenderness.    Musculoskeletal: She exhibits no edema or tenderness.  Lymphadenopathy:    She has no cervical adenopathy.  Neurological: She is alert and oriented to person, place, and time.  Skin: No rash noted. No erythema.  Psychiatric: She has a normal mood and affect. Her behavior is normal.    BP 126/78 (BP Location: Left Arm, Patient  Position: Sitting, Cuff Size: Large)   Pulse 78   Temp (!) 97.5 F (36.4 C) (Oral)   Resp 18   Ht 5' 7" (1.702 m)   Wt (!) 365 lb 12.8 oz (165.9 kg)   SpO2 96%   BMI 57.29 kg/m  Wt Readings from Last 3 Encounters:  10/02/18 (!) 365 lb 12.8 oz (165.9 kg)  08/04/18 (!) 366 lb 3.2 oz (166.1 kg)  06/13/18 (!) 363 lb 4 oz (164.8 kg)     Lab Results  Component Value Date   WBC 7.1 01/13/2018   HGB 13.1 01/13/2018   HCT 39.7 01/13/2018   PLT 201 01/13/2018   GLUCOSE 127 (H) 01/13/2018   CHOL 149 01/13/2018   TRIG 117 01/13/2018   HDL 34 (L) 01/13/2018   LDLCALC 92 01/13/2018   ALT  29 01/13/2018   AST 17 01/13/2018   NA 139 01/13/2018   K 4.5 01/13/2018   CL 102 01/13/2018   CREATININE 0.71 01/13/2018   BUN 13 01/13/2018   CO2 25 01/13/2018   TSH 2.650 01/13/2018   HGBA1C 5.8 (H) 01/13/2018    Mm Screening Breast Tomo Bilateral  Result Date: 01/19/2018 CLINICAL DATA:  Screening. EXAM: DIGITAL SCREENING BILATERAL MAMMOGRAM WITH TOMO AND CAD COMPARISON:  Previous exam(s). ACR Breast Density Category b: There are scattered areas of fibroglandular density. FINDINGS: There are no findings suspicious for malignancy. Images were processed with CAD. IMPRESSION: No mammographic evidence of malignancy. A result letter of this screening mammogram will be mailed directly to the patient. RECOMMENDATION: Screening mammogram in one year. (Code:SM-B-01Y) BI-RADS CATEGORY  1: Negative. Electronically Signed   By: Claudie Revering M.D.   On: 01/19/2018 10:25       Assessment & Plan:   Problem List Items Addressed This Visit    Essential hypertension, benign    Blood pressure under good control.  Continue same medication regimen.  Follow pressures.  Follow metabolic panel.        Fatty infiltration of liver    Discussed diet, exercise and weight loss.  Follow liver function tests.       Health care maintenance    Physical today 10/02/18.  PAP 10/02/18.  Mammogram 01/19/18 - [facility] I.         Hypercholesterolemia    On crestor.  Low cholesterol diet and exercise.  Follow lipid panel and liver function tests.        Hyperglycemia    Low carb diet and exercise.  Follow met b and a1c.        Hypothyroidism (acquired)    On thyroid replacement.  Follow tsh.        Stress    Increased stress.  Discussed with her today.  Overall she feels she is doing relatively well.  Follow.  On zoloft.         Other Visit Diagnoses    Routine general medical examination at a health care facility    -  Primary   Cervical cancer screening       Relevant Orders   Cytology - PAP( [place])   Vaginal discharge       Relevant Orders   Cervicovaginal ancillary only( [place])       Einar Pheasant, MD

## 2018-10-04 LAB — VAGINITIS/VAGINOSIS, DNA PROBE
Candida Species: POSITIVE — AB
Gardnerella vaginalis: POSITIVE — AB
TRICHOMONAS VAG: NEGATIVE

## 2018-10-05 LAB — PAP LB AND HPV HIGH-RISK: HPV, HIGH-RISK: POSITIVE — AB

## 2018-10-07 ENCOUNTER — Encounter: Payer: Self-pay | Admitting: Internal Medicine

## 2018-10-07 NOTE — Assessment & Plan Note (Signed)
Increased stress.  Discussed with her today.  Overall she feels she is doing relatively well.  Follow.  On zoloft.

## 2018-10-07 NOTE — Assessment & Plan Note (Signed)
Low carb diet and exercise.  Follow met b and a1c.   

## 2018-10-07 NOTE — Assessment & Plan Note (Signed)
On thyroid replacement.  Follow tsh.  

## 2018-10-07 NOTE — Assessment & Plan Note (Signed)
On crestor.  Low cholesterol diet and exercise.  Follow lipid panel and liver function tests.   

## 2018-10-07 NOTE — Assessment & Plan Note (Signed)
Blood pressure under good control.  Continue same medication regimen.  Follow pressures.  Follow metabolic panel.   

## 2018-10-07 NOTE — Assessment & Plan Note (Signed)
Discussed diet, exercise and weight loss.  Follow liver function tests.   

## 2018-10-07 NOTE — Assessment & Plan Note (Signed)
Physical today 10/02/18.  PAP 10/02/18.  Mammogram 01/19/18 - [facility] I.

## 2018-10-11 ENCOUNTER — Other Ambulatory Visit: Payer: Self-pay | Admitting: Internal Medicine

## 2018-10-11 MED ORDER — METRONIDAZOLE 0.75 % VA GEL: 1.0000 | Freq: Two times a day (BID) | VAGINAL | 0 refills | Status: DC

## 2018-10-11 MED ORDER — FLUCONAZOLE 150 MG PO TABS: 150.0000 mg | ORAL_TABLET | Freq: Once | ORAL | 0 refills | Status: AC

## 2018-10-11 NOTE — Progress Notes (Signed)
rx sent in for diflucan x 1 and metrogel.

## 2018-10-25 ENCOUNTER — Other Ambulatory Visit: Payer: Self-pay | Admitting: Internal Medicine

## 2018-11-09 ENCOUNTER — Encounter: Payer: Self-pay | Admitting: Internal Medicine

## 2018-11-09 DIAGNOSIS — I10 Essential (primary) hypertension: Secondary | ICD-10-CM

## 2018-11-09 NOTE — Telephone Encounter (Signed)
Orders placed for labs

## 2018-12-02 ENCOUNTER — Encounter: Payer: Self-pay | Admitting: Internal Medicine

## 2018-12-04 ENCOUNTER — Other Ambulatory Visit: Payer: Self-pay

## 2018-12-04 MED ORDER — BUSPIRONE HCL 5 MG PO TABS: 5.0000 mg | ORAL_TABLET | Freq: Two times a day (BID) | ORAL | 1 refills | Status: DC | PRN

## 2018-12-04 MED ORDER — LISINOPRIL-HYDROCHLOROTHIAZIDE 20-25 MG PO TABS: 1.0000 | ORAL_TABLET | Freq: Every day | ORAL | 1 refills | Status: DC

## 2018-12-04 MED ORDER — SERTRALINE HCL 100 MG PO TABS: 100.0000 mg | ORAL_TABLET | Freq: Every day | ORAL | 1 refills | Status: DC

## 2018-12-20 ENCOUNTER — Other Ambulatory Visit: Payer: Self-pay | Admitting: Internal Medicine

## 2018-12-20 ENCOUNTER — Telehealth: Payer: Self-pay | Admitting: Internal Medicine

## 2018-12-20 DIAGNOSIS — R7989 Other specified abnormal findings of blood chemistry: Secondary | ICD-10-CM

## 2018-12-20 DIAGNOSIS — R945 Abnormal results of liver function studies: Secondary | ICD-10-CM

## 2018-12-20 LAB — LIPID PANEL
CHOL/HDL RATIO: 4.1 ratio (ref 0.0–4.4)
Cholesterol, Total: 147 mg/dL (ref 100–199)
HDL: 36 mg/dL — ABNORMAL LOW (ref >39)
LDL CALC: 85 mg/dL (ref 0–99)
Triglycerides: 132 mg/dL (ref 0–149)
VLDL CHOLESTEROL CAL: 26 mg/dL (ref 5–40)

## 2018-12-20 LAB — BASIC METABOLIC PANEL
BUN / CREAT RATIO: 13 (ref 9–23)
BUN: 10 mg/dL (ref 6–24)
CO2: 23 mmol/L (ref 20–29)
CREATININE: 0.79 mg/dL (ref 0.57–1.00)
Calcium: 9.4 mg/dL (ref 8.7–10.2)
Chloride: 101 mmol/L (ref 96–106)
GFR calc Af Amer: 106 mL/min/{1.73_m2} (ref >59)
GFR, EST NON AFRICAN AMERICAN: 92 mL/min/{1.73_m2} (ref >59)
GLUCOSE: 123 mg/dL — AB (ref 65–99)
POTASSIUM: 4.6 mmol/L (ref 3.5–5.2)
Sodium: 138 mmol/L (ref 134–144)

## 2018-12-20 LAB — CBC WITH DIFFERENTIAL/PLATELET
Basophils Absolute: 0.1 10*3/uL (ref 0.0–0.2)
Basos: 1 %
EOS (ABSOLUTE): 0.2 10*3/uL (ref 0.0–0.4)
Eos: 3 %
HEMOGLOBIN: 14.1 g/dL (ref 11.1–15.9)
Hematocrit: 41.4 % (ref 34.0–46.6)
IMMATURE GRANS (ABS): 0 10*3/uL (ref 0.0–0.1)
Immature Granulocytes: 0 %
LYMPHS: 28 %
Lymphocytes Absolute: 2.3 10*3/uL (ref 0.7–3.1)
MCH: 29.6 pg (ref 26.6–33.0)
MCHC: 34.1 g/dL (ref 31.5–35.7)
MCV: 87 fL (ref 79–97)
MONOCYTES: 5 %
Monocytes Absolute: 0.4 10*3/uL (ref 0.1–0.9)
NEUTROS ABS: 5.3 10*3/uL (ref 1.4–7.0)
Neutrophils: 63 %
Platelets: 253 10*3/uL (ref 150–450)
RBC: 4.77 x10E6/uL (ref 3.77–5.28)
RDW: 13 % (ref 11.7–15.4)
WBC: 8.3 10*3/uL (ref 3.4–10.8)

## 2018-12-20 LAB — HEMOGLOBIN A1C
ESTIMATED AVERAGE GLUCOSE: 140 mg/dL
Hgb A1c MFr Bld: 6.5 % — ABNORMAL HIGH (ref 4.8–5.6)

## 2018-12-20 LAB — VITAMIN D 25 HYDROXY (VIT D DEFICIENCY, FRACTURES): Vit D, 25-Hydroxy: 25.2 ng/mL — ABNORMAL LOW (ref 30.0–100.0)

## 2018-12-20 LAB — HEPATIC FUNCTION PANEL
ALBUMIN: 4.2 g/dL (ref 3.8–4.8)
ALK PHOS: 104 IU/L (ref 39–117)
ALT: 42 IU/L — ABNORMAL HIGH (ref 0–32)
AST: 26 IU/L (ref 0–40)
BILIRUBIN, DIRECT: 0.17 mg/dL (ref 0.00–0.40)
Bilirubin Total: 0.7 mg/dL (ref 0.0–1.2)
TOTAL PROTEIN: 7.3 g/dL (ref 6.0–8.5)

## 2018-12-20 LAB — VITAMIN B12: VITAMIN B 12: 461 pg/mL (ref 232–1245)

## 2018-12-20 LAB — TSH: TSH: 2.29 u[IU]/mL (ref 0.450–4.500)

## 2018-12-20 LAB — FOLATE RBC
Folate, Hemolysate: 405 ng/mL
Folate, RBC: 957 ng/mL (ref >498)
Hematocrit: 42.3 % (ref 34.0–46.6)

## 2018-12-20 LAB — FERRITIN: Ferritin: 259 ng/mL — ABNORMAL HIGH (ref 15–150)

## 2018-12-20 NOTE — Telephone Encounter (Signed)
Orders placed for [facility] labs.   

## 2018-12-20 NOTE — Telephone Encounter (Signed)
Order placed for f/u liver panel to be checked at [facility]

## 2019-01-10 ENCOUNTER — Other Ambulatory Visit: Payer: Self-pay | Admitting: Internal Medicine

## 2019-01-10 DIAGNOSIS — Z1231 Encounter for screening mammogram for malignant neoplasm of breast: Secondary | ICD-10-CM

## 2019-01-17 LAB — CBC WITH DIFFERENTIAL/PLATELET
BASOS ABS: 0.1 10*3/uL (ref 0.0–0.2)
Basos: 1 %
EOS (ABSOLUTE): 0.2 10*3/uL (ref 0.0–0.4)
Eos: 2 %
Hematocrit: 40.2 % (ref 34.0–46.6)
Hemoglobin: 13.5 g/dL (ref 11.1–15.9)
IMMATURE GRANULOCYTES: 0 %
Immature Grans (Abs): 0 10*3/uL (ref 0.0–0.1)
Lymphocytes Absolute: 3.1 10*3/uL (ref 0.7–3.1)
Lymphs: 32 %
MCH: 29.2 pg (ref 26.6–33.0)
MCHC: 33.6 g/dL (ref 31.5–35.7)
MCV: 87 fL (ref 79–97)
Monocytes Absolute: 0.6 10*3/uL (ref 0.1–0.9)
Monocytes: 6 %
Neutrophils Absolute: 5.9 10*3/uL (ref 1.4–7.0)
Neutrophils: 59 %
Platelets: 264 10*3/uL (ref 150–450)
RBC: 4.62 x10E6/uL (ref 3.77–5.28)
RDW: 13.3 % (ref 11.7–15.4)
WBC: 9.9 10*3/uL (ref 3.4–10.8)

## 2019-01-17 LAB — HEPATIC FUNCTION PANEL
ALT: 38 IU/L — ABNORMAL HIGH (ref 0–32)
AST: 27 IU/L (ref 0–40)
Albumin: 4.3 g/dL (ref 3.8–4.8)
Alkaline Phosphatase: 95 IU/L (ref 39–117)
Bilirubin Total: 0.7 mg/dL (ref 0.0–1.2)
Bilirubin, Direct: 0.19 mg/dL (ref 0.00–0.40)
Total Protein: 7.6 g/dL (ref 6.0–8.5)

## 2019-01-17 LAB — IRON AND TIBC
Iron Saturation: 18 % (ref 15–55)
Iron: 60 ug/dL (ref 27–159)
TIBC: 331 ug/dL (ref 250–450)
UIBC: 271 ug/dL (ref 131–425)

## 2019-01-17 LAB — FERRITIN: FERRITIN: 232 ng/mL — AB (ref 15–150)

## 2019-01-18 ENCOUNTER — Ambulatory Visit: Payer: Managed Care, Other (non HMO) | Admitting: Internal Medicine

## 2019-01-18 DIAGNOSIS — E7800 Pure hypercholesterolemia, unspecified: Secondary | ICD-10-CM | POA: Diagnosis not present

## 2019-01-18 DIAGNOSIS — K760 Fatty (change of) liver, not elsewhere classified: Secondary | ICD-10-CM | POA: Diagnosis not present

## 2019-01-18 DIAGNOSIS — I10 Essential (primary) hypertension: Secondary | ICD-10-CM

## 2019-01-18 DIAGNOSIS — R739 Hyperglycemia, unspecified: Secondary | ICD-10-CM

## 2019-01-18 DIAGNOSIS — E039 Hypothyroidism, unspecified: Secondary | ICD-10-CM

## 2019-01-18 NOTE — Progress Notes (Signed)
Patient ID: Jenna Winters, female   DOB: 06-02-1976, 43 y.o.   MRN: [id number]   Subjective:    Patient ID: Jenna Winters, female    DOB: 1976/03/25, 43 y.o.   MRN: [id number]  HPI  Patient here for evaluation for bariatric surgery.  Also need form completed.  She has adjusted her diet.  Has stopped drinking sweet tea and soft drinks.  Also trying to cut down on her sugar intake and carb intake.  Feels better.  Has more energy.  Has lost weight.  No chest pain.  No sob.  No acid reflux.  Using a mouth piece to help with her sleep.  Had recent EKG.  States being referred to cardiology prior to bariatric surgery.  Seeing Dr Daine Floras 02/08/19.  F/u with the surgeon 02/20/19.  Needing to lose weight and get her BMI below 52 before endoscopy.  Has seen a psychologist.  Also seeing a nutritionist.  No abdominal pain.  Bowels moving.  She has tried for years to lose weight.  Will lose some and then gain back more than she lost.  This has happened on multiple occasions.  She has tried LA weight loss, Weight Watchers, Nubody Solution, Slim Fast, Cabbage soup diet and just adjusting carb intake.  Has tried going to the gym to exercise.  Has also just tried to get out and walk.     Past Medical History:  Diagnosis Date  . Depression   . Hemorrhoid   . Hypertension   . Hyperthyroidism 2009   s/p ablation with resulting hypothyroidism   Past Surgical History:  Procedure Laterality Date  . ABDOMINAL HYSTERECTOMY  08/2011  . BREAST BIOPSY Left 2015   core bx fibroadenoma  . COLONOSCOPY    . DILATION AND CURETTAGE OF UTERUS  12/2010  . DILATION AND CURETTAGE OF UTERUS  1995  . HEMORRHOID SURGERY  08/2013   Family History  Problem Relation Age of Onset  . Hyperlipidemia Father   . Hypertension Father   . Diabetes Father   . Hyperlipidemia Paternal Grandmother   . Hypertension Paternal Grandmother   . Diabetes Paternal Grandmother   . Hyperlipidemia Paternal Grandfather   . Hypertension Paternal  Grandfather   . Diabetes Paternal Grandfather   . Lung cancer Paternal Grandfather   . COPD Paternal Grandfather        lung  . Breast cancer Neg Hx    Social History   Socioeconomic History  . Marital status: Divorced    Spouse name: Not on file  . Number of children: 1  . Years of education: Not on file  . Highest education level: Not on file  Occupational History  . Occupation: Buyer, retail: [employer]  Social Needs  . Financial resource strain: Not on file  . Food insecurity:    Worry: Not on file    Inability: Not on file  . Transportation needs:    Medical: Not on file    Non-medical: Not on file  Tobacco Use  . Smoking status: Never Smoker  . Smokeless tobacco: Never Used  Substance and Sexual Activity  . Alcohol use: Yes    Alcohol/week: 0.0 standard drinks    Comment: rare occasion  . Drug use: No  . Sexual activity: Not on file  Lifestyle  . Physical activity:    Days per week: Not on file    Minutes per session: Not on file  . Stress: Not on file  Relationships  .  Social connections:    Talks on phone: Not on file    Gets together: Not on file    Attends religious service: Not on file    Active member of club or organization: Not on file    Attends meetings of clubs or organizations: Not on file    Relationship status: Not on file  Other Topics Concern  . Not on file  Social History Narrative  . Not on file    Outpatient Encounter Medications as of 01/18/2019  Medication Sig  . busPIRone (BUSPAR) 5 MG tablet Take 1 tablet (5 mg total) by mouth 2 (two) times daily as needed.  . cholecalciferol (VITAMIN D) 1000 UNITS tablet Take 1,000 Units by mouth daily.  Marland Kitchen levothyroxine (SYNTHROID, LEVOTHROID) 175 MCG tablet TAKE 1 TABLET BY MOUTH  DAILY BEFORE BREAKFAST  . lisinopril-hydrochlorothiazide (PRINZIDE,ZESTORETIC) 20-25 MG tablet Take 1 tablet by mouth daily.  . metoprolol succinate (TOPROL-XL) 50 MG 24 hr tablet TAKE 1 TABLET BY MOUTH 2   TIMES DAILY. TAKE WITH OR  IMMEDIATELY FOLLOWING  MEAL(S).  . metroNIDAZOLE (METROGEL VAGINAL) 0.75 % vaginal gel Place 1 Applicatorful vaginally 2 (two) times daily. One applicator bid x 5 days  . QSYMIA 7.5-46 MG CP24 TK 1 C PO D  . rosuvastatin (CRESTOR) 5 MG tablet TAKE 1 TABLET BY MOUTH DAILY  . sertraline (ZOLOFT) 100 MG tablet Take 1 tablet (100 mg total) by mouth daily.  Marland Kitchen triamcinolone cream (KENALOG) 0.1 % Apply 1 application topically 2 (two) times daily.   No facility-administered encounter medications on file as of 01/18/2019.     Review of Systems  Constitutional: Negative for appetite change.       Has adjusted her diet.  Lost weight.    HENT: Negative for congestion and sinus pressure.   Respiratory: Negative for cough, chest tightness and shortness of breath.   Cardiovascular: Negative for chest pain, palpitations and leg swelling.  Gastrointestinal: Negative for abdominal pain, diarrhea, nausea and vomiting.  Genitourinary: Negative for difficulty urinating and dysuria.  Musculoskeletal: Negative for joint swelling and myalgias.  Skin: Negative for color change and rash.  Neurological: Negative for dizziness, light-headedness and headaches.  Psychiatric/Behavioral: Negative for agitation and dysphoric mood.       Objective:    Physical Exam Constitutional:      General: She is not in acute distress.    Appearance: Normal appearance.  HENT:     Nose: Nose normal. No congestion.     Mouth/Throat:     Pharynx: No oropharyngeal exudate or posterior oropharyngeal erythema.  Neck:     Musculoskeletal: Neck supple. No muscular tenderness.     Thyroid: No thyromegaly.  Cardiovascular:     Rate and Rhythm: Normal rate and regular rhythm.  Pulmonary:     Effort: No respiratory distress.     Breath sounds: Normal breath sounds. No wheezing.  Abdominal:     General: Bowel sounds are normal.     Palpations: Abdomen is soft.     Tenderness: There is no abdominal  tenderness.  Musculoskeletal:        General: No swelling or tenderness.  Lymphadenopathy:     Cervical: No cervical adenopathy.  Skin:    Findings: No erythema or rash.  Neurological:     Mental Status: She is alert.  Psychiatric:        Mood and Affect: Mood normal.        Behavior: Behavior normal.     BP 124/76  Pulse 83   Temp 97.7 F (36.5 C) (Oral)   Resp 16   Wt (!) 349 lb (158.3 kg)   SpO2 97%   BMI 54.66 kg/m  Wt Readings from Last 3 Encounters:  01/18/19 (!) 349 lb (158.3 kg)  10/02/18 (!) 365 lb 12.8 oz (165.9 kg)  08/04/18 (!) 366 lb 3.2 oz (166.1 kg)     Lab Results  Component Value Date   WBC 9.9 01/16/2019   HGB 13.5 01/16/2019   HCT 40.2 01/16/2019   PLT 264 01/16/2019   GLUCOSE 123 (H) 12/19/2018   CHOL 147 12/19/2018   TRIG 132 12/19/2018   HDL 36 (L) 12/19/2018   LDLCALC 85 12/19/2018   ALT 38 (H) 01/16/2019   AST 27 01/16/2019   NA 138 12/19/2018   K 4.6 12/19/2018   CL 101 12/19/2018   CREATININE 0.79 12/19/2018   BUN 10 12/19/2018   CO2 23 12/19/2018   TSH 2.290 12/19/2018   HGBA1C 6.5 (H) 12/19/2018    Mm Screening Breast Tomo Bilateral  Result Date: 01/19/2018 CLINICAL DATA:  Screening. EXAM: DIGITAL SCREENING BILATERAL MAMMOGRAM WITH TOMO AND CAD COMPARISON:  Previous exam(s). ACR Breast Density Category b: There are scattered areas of fibroglandular density. FINDINGS: There are no findings suspicious for malignancy. Images were processed with CAD. IMPRESSION: No mammographic evidence of malignancy. A result letter of this screening mammogram will be mailed directly to the patient. RECOMMENDATION: Screening mammogram in one year. (Code:SM-B-01Y) BI-RADS CATEGORY  1: Negative. Electronically Signed   By: Claudie Revering M.D.   On: 01/19/2018 10:25       Assessment & Plan:   Problem List Items Addressed This Visit    Essential hypertension, benign    Blood pressure under good control.  Continue same medication regimen.  Follow  pressures.  Follow metabolic panel.        Fatty infiltration of liver    Discussed diet, exercise and weight loss.  She has adjusted her diet.  Started exercising and lost weight. Follow liver function tests.        Hypercholesterolemia    Low cholesterol diet and exercise.  Follow lipid panel. On crestor.          Hyperglycemia    Low carb diet and exercise.  Follow met b and a1c.        Hypothyroidism (acquired)    On thyroid replacement.  Follow tsh.       Obesity, morbid, BMI 50 or higher ([place])    She has adjusted her diet.  Lost weight.  Discussed diet, exercise and weight loss.  Planning for bariatric surgery.        Relevant Medications   QSYMIA 7.5-46 MG CP24       Einar Pheasant, MD

## 2019-01-20 ENCOUNTER — Encounter: Payer: Self-pay | Admitting: Internal Medicine

## 2019-01-20 NOTE — Assessment & Plan Note (Signed)
Low carb diet and exercise.  Follow met b and a1c.   

## 2019-01-20 NOTE — Assessment & Plan Note (Signed)
Low cholesterol diet and exercise.  Follow lipid panel.  On crestor.   

## 2019-01-20 NOTE — Assessment & Plan Note (Signed)
Blood pressure under good control.  Continue same medication regimen.  Follow pressures.  Follow metabolic panel.   

## 2019-01-20 NOTE — Assessment & Plan Note (Signed)
On thyroid replacement.  Follow tsh.  

## 2019-01-20 NOTE — Assessment & Plan Note (Signed)
Discussed diet, exercise and weight loss.  She has adjusted her diet.  Started exercising and lost weight. Follow liver function tests.

## 2019-01-20 NOTE — Assessment & Plan Note (Signed)
She has adjusted her diet.  Lost weight.  Discussed diet, exercise and weight loss.  Planning for bariatric surgery.

## 2019-01-25 ENCOUNTER — Ambulatory Visit
Admission: RE | Admit: 2019-01-25 | Discharge: 2019-01-25 | Disposition: A | Discharge status: Home / Self Care | Payer: Managed Care, Other (non HMO) | Source: Ambulatory Visit | Attending: Internal Medicine | Admitting: Internal Medicine

## 2019-01-25 DIAGNOSIS — Z1231 Encounter for screening mammogram for malignant neoplasm of breast: Secondary | ICD-10-CM | POA: Diagnosis not present

## 2019-01-29 ENCOUNTER — Other Ambulatory Visit: Payer: Self-pay

## 2019-01-29 DIAGNOSIS — Z85828 Personal history of other malignant neoplasm of skin: Secondary | ICD-10-CM

## 2019-01-29 HISTORY — DX: Personal history of other malignant neoplasm of skin: Z85.828

## 2019-01-29 MED ORDER — BUSPIRONE HCL 5 MG PO TABS: 5.0000 mg | ORAL_TABLET | Freq: Two times a day (BID) | ORAL | 1 refills | Status: DC | PRN

## 2019-02-08 ENCOUNTER — Ambulatory Visit: Payer: Managed Care, Other (non HMO) | Admitting: Internal Medicine

## 2019-02-26 ENCOUNTER — Other Ambulatory Visit: Payer: Self-pay | Admitting: Internal Medicine

## 2019-03-04 ENCOUNTER — Encounter: Payer: Self-pay | Admitting: Internal Medicine

## 2019-03-05 ENCOUNTER — Other Ambulatory Visit: Payer: Self-pay

## 2019-03-05 MED ORDER — LEVOTHYROXINE SODIUM 175 MCG PO TABS: ORAL_TABLET | ORAL | 1 refills | Status: DC

## 2019-05-29 ENCOUNTER — Other Ambulatory Visit: Payer: Self-pay

## 2019-05-29 ENCOUNTER — Encounter: Payer: Self-pay | Admitting: Internal Medicine

## 2019-05-29 ENCOUNTER — Ambulatory Visit (INDEPENDENT_AMBULATORY_CARE_PROVIDER_SITE_OTHER): Payer: Managed Care, Other (non HMO) | Admitting: Internal Medicine

## 2019-05-29 DIAGNOSIS — I10 Essential (primary) hypertension: Secondary | ICD-10-CM | POA: Diagnosis not present

## 2019-05-29 DIAGNOSIS — E7800 Pure hypercholesterolemia, unspecified: Secondary | ICD-10-CM

## 2019-05-29 DIAGNOSIS — E039 Hypothyroidism, unspecified: Secondary | ICD-10-CM

## 2019-05-29 DIAGNOSIS — F439 Reaction to severe stress, unspecified: Secondary | ICD-10-CM

## 2019-05-29 DIAGNOSIS — K760 Fatty (change of) liver, not elsewhere classified: Secondary | ICD-10-CM | POA: Diagnosis not present

## 2019-05-29 NOTE — Progress Notes (Signed)
Patient ID: Jenna Winters, female   DOB: [birth year], 43 y.o.   MRN: [id number]   Virtual Visit via video Note  This visit type was conducted due to national recommendations for restrictions regarding the COVID-19 pandemic (e.g. social distancing).  This format is felt to be most appropriate for this patient at this time.  All issues noted in this document were discussed and addressed.  No physical exam was performed (except for noted visual exam findings with Video Visits).   I connected with Marelly Denherder by a video enabled telemedicine application or telephone and verified that I am speaking with the correct person using two identifiers. Location patient: home Location provider: work  Persons participating in the virtual visit: patient, provider  I discussed the limitations, risks, security and privacy concerns of performing an evaluation and management service by video and the availability of in person appointments.  The patient expressed understanding and agreed to proceed.   Reason for visit: scheduled follow up.   HPI: She is doing well.  Has adjusted her diet.  Has lost weight.  States she weighs 322 pounds today.  Was 349 pounds at 01/18/19 visit.  She is planning to have bariatric surgery 06/11/19 (Dr Daine Floras).  She saw cardiology 02/08/19.  Reviewed note.  Felt no further cardiac w/up warranted. With her weight loss, she is off zantac.  No acid reflux.  No swallowing problems.  No chest pain or sob.  No abdominal pain.  Bowels moving. Off buspar for 3 weeks.  Has tapered down her zoloft.  Taking 50mg  q day now.  Feels good.  States blood pressure has been doing well.     ROS: See pertinent positives and negatives per HPI.  Past Medical History:  Diagnosis Date  . Depression   . Hemorrhoid   . Hypertension   . Hyperthyroidism 2009   s/p ablation with resulting hypothyroidism    Past Surgical History:  Procedure Laterality Date  . ABDOMINAL HYSTERECTOMY  08/2011  . BREAST  BIOPSY Left 2015   core bx fibroadenoma  . COLONOSCOPY    . DILATION AND CURETTAGE OF UTERUS  12/2010  . DILATION AND CURETTAGE OF UTERUS  1995  . HEMORRHOID SURGERY  08/2013    Family History  Problem Relation Age of Onset  . Hyperlipidemia Father   . Hypertension Father   . Diabetes Father   . Hyperlipidemia Paternal Grandmother   . Hypertension Paternal Grandmother   . Diabetes Paternal Grandmother   . Hyperlipidemia Paternal Grandfather   . Hypertension Paternal Grandfather   . Diabetes Paternal Grandfather   . Lung cancer Paternal Grandfather   . COPD Paternal Grandfather        lung  . Breast cancer Neg Hx     SOCIAL HX: reviewed.    Current Outpatient Medications:  .  busPIRone (BUSPAR) 5 MG tablet, Take 1 tablet (5 mg total) by mouth 2 (two) times daily as needed., Disp: 60 tablet, Rfl: 1 .  cholecalciferol (VITAMIN D) 1000 UNITS tablet, Take 1,000 Units by mouth daily., Disp: , Rfl:  .  levothyroxine (SYNTHROID, LEVOTHROID) 175 MCG tablet, TAKE 1 TABLET BY MOUTH  DAILY BEFORE BREAKFAST, Disp: 90 tablet, Rfl: 1 .  lisinopril-hydrochlorothiazide (ZESTORETIC) 20-25 MG tablet, Take 1 tablet by mouth daily., Disp: 90 tablet, Rfl: 1 .  metoprolol succinate (TOPROL-XL) 50 MG 24 hr tablet, TAKE 1 TABLET BY MOUTH TWICE DAILY TAKE WITH OR IMMEDIATELY FOLLOWING MEALS, Disp: 180 tablet, Rfl: 1 .  rosuvastatin (CRESTOR) 5  MG tablet, TAKE 1 TABLET BY MOUTH DAILY, Disp: 90 tablet, Rfl: 1 .  sertraline (ZOLOFT) 50 MG tablet, Take 1 tablet (50 mg total) by mouth daily., Disp: 90 tablet, Rfl: 1 .  triamcinolone cream (KENALOG) 0.1 %, Apply 1 application topically 2 (two) times daily., Disp: 30 g, Rfl: 1  EXAM:  VITALS per patient if applicable: 830/94, 75  GENERAL: alert, oriented, appears well and in no acute distress  HEENT: atraumatic, conjunttiva clear, no obvious abnormalities on inspection of external nose and ears  NECK: normal movements of the head and neck  LUNGS: on  inspection no signs of respiratory distress, breathing rate appears normal, no obvious gross SOB, gasping or wheezing  CV: no obvious cyanosis  PSYCH/NEURO: pleasant and cooperative, no obvious depression or anxiety, speech and thought processing grossly intact  ASSESSMENT AND PLAN:  Discussed the following assessment and plan:  Essential hypertension, benign Blood pressure as outlined.  She reports it has been doing well.  Ask her to spot check her pressure.  Continue current medication regimen.  Discussed labs.  Planning to have labs checked prior to her surgery.  Will forward copy.    Fatty infiltration of liver Has adjusted her diet.  Lost weight.  Follow liver function tests.    Hypercholesterolemia Has adjusted her diet.  Lost weight.  Continue diet and exercise.  Follow lipid panel.   Hypothyroidism (acquired) On thyroid replacement.  Follow tsh.   Stress Doing better.  Off buspar.  zoloft 50mg  q day now.  Will continue this dose for now.  Follow.      I discussed the assessment and treatment plan with the patient. The patient was provided an opportunity to ask questions and all were answered. The patient agreed with the plan and demonstrated an understanding of the instructions.   The patient was advised to call back or seek an in-person evaluation if the symptoms worsen or if the condition fails to improve as anticipated.   Einar Pheasant, MD

## 2019-05-30 ENCOUNTER — Encounter: Payer: Self-pay | Admitting: Internal Medicine

## 2019-05-31 MED ORDER — LISINOPRIL-HYDROCHLOROTHIAZIDE 20-25 MG PO TABS: 1.0000 | ORAL_TABLET | Freq: Every day | ORAL | 1 refills | Status: DC

## 2019-05-31 MED ORDER — SERTRALINE HCL 50 MG PO TABS: 50.0000 mg | ORAL_TABLET | Freq: Every day | ORAL | 1 refills | Status: DC

## 2019-05-31 NOTE — Telephone Encounter (Signed)
I will send in rx, but just wanted to confirm that we are sending decreased dose of zoloft in.

## 2019-05-31 NOTE — Telephone Encounter (Signed)
rx sent in for zoloft #90 with one refill and lisinopril/hctz #90 with 1 refill.

## 2019-06-03 NOTE — Assessment & Plan Note (Signed)
On thyroid replacement.  Follow tsh.  

## 2019-06-03 NOTE — Assessment & Plan Note (Signed)
Doing better.  Off buspar.  zoloft 50mg  q day now.  Will continue this dose for now.  Follow.

## 2019-06-03 NOTE — Assessment & Plan Note (Signed)
Has adjusted her diet.  Lost weight.  Follow liver function tests.

## 2019-06-03 NOTE — Assessment & Plan Note (Signed)
Blood pressure as outlined.  She reports it has been doing well.  Ask her to spot check her pressure.  Continue current medication regimen.  Discussed labs.  Planning to have labs checked prior to her surgery.  Will forward copy.

## 2019-06-03 NOTE — Assessment & Plan Note (Signed)
Has adjusted her diet.  Lost weight.  Continue diet and exercise.  Follow lipid panel.

## 2019-07-06 LAB — LIPID PANEL
Cholesterol: 115 (ref 0–200)
HDL: 24 — AB (ref 35–70)
LDL Cholesterol: 70
Triglycerides: 104 (ref 40–160)

## 2019-07-06 LAB — BASIC METABOLIC PANEL: Glucose: 130

## 2019-07-06 LAB — HEMOGLOBIN A1C: Hemoglobin A1C: 5.6

## 2019-08-31 ENCOUNTER — Encounter: Payer: Self-pay | Admitting: Internal Medicine

## 2019-09-03 ENCOUNTER — Other Ambulatory Visit: Payer: Self-pay

## 2019-09-03 MED ORDER — METOPROLOL SUCCINATE ER 50 MG PO TB24: ORAL_TABLET | ORAL | 1 refills | Status: DC

## 2019-09-03 MED ORDER — LEVOTHYROXINE SODIUM 175 MCG PO TABS: ORAL_TABLET | ORAL | 1 refills | Status: DC

## 2019-09-14 ENCOUNTER — Telehealth: Payer: Self-pay

## 2019-09-14 NOTE — Telephone Encounter (Signed)
Was calling pt to clarify where husbands form needs to be sent. Requested be sent by secure email to [email or web address]. Will send once visit is complete.

## 2019-09-14 NOTE — Telephone Encounter (Signed)
Copied from [place] [id number]. Topic: General - Other >> Sep 14, 2019  9:09 AM Oneta Rack wrote: Patient returning Gilmore Laroche call, please advise

## 2019-09-23 ENCOUNTER — Telehealth: Payer: Self-pay | Admitting: Internal Medicine

## 2019-09-23 DIAGNOSIS — R739 Hyperglycemia, unspecified: Secondary | ICD-10-CM

## 2019-09-23 DIAGNOSIS — E7800 Pure hypercholesterolemia, unspecified: Secondary | ICD-10-CM

## 2019-09-23 DIAGNOSIS — E039 Hypothyroidism, unspecified: Secondary | ICD-10-CM

## 2019-09-23 DIAGNOSIS — I10 Essential (primary) hypertension: Secondary | ICD-10-CM

## 2019-09-23 DIAGNOSIS — K760 Fatty (change of) liver, not elsewhere classified: Secondary | ICD-10-CM

## 2019-09-23 DIAGNOSIS — Z9884 Bariatric surgery status: Secondary | ICD-10-CM

## 2019-09-23 NOTE — Telephone Encounter (Signed)
During visit with pts husband, request make for labs to be ordered through [facility].

## 2019-10-10 ENCOUNTER — Encounter: Payer: Managed Care, Other (non HMO) | Admitting: Internal Medicine

## 2019-11-28 ENCOUNTER — Encounter: Payer: Self-pay | Admitting: Internal Medicine

## 2019-12-04 ENCOUNTER — Other Ambulatory Visit: Payer: Self-pay

## 2019-12-04 ENCOUNTER — Ambulatory Visit (INDEPENDENT_AMBULATORY_CARE_PROVIDER_SITE_OTHER): Payer: Managed Care, Other (non HMO) | Admitting: Internal Medicine

## 2019-12-04 DIAGNOSIS — I10 Essential (primary) hypertension: Secondary | ICD-10-CM

## 2019-12-04 DIAGNOSIS — E039 Hypothyroidism, unspecified: Secondary | ICD-10-CM

## 2019-12-04 DIAGNOSIS — R739 Hyperglycemia, unspecified: Secondary | ICD-10-CM

## 2019-12-04 DIAGNOSIS — F439 Reaction to severe stress, unspecified: Secondary | ICD-10-CM

## 2019-12-04 DIAGNOSIS — K760 Fatty (change of) liver, not elsewhere classified: Secondary | ICD-10-CM

## 2019-12-04 MED ORDER — SERTRALINE HCL 50 MG PO TABS: 50.0000 mg | ORAL_TABLET | Freq: Every day | ORAL | 1 refills | Status: DC

## 2019-12-04 MED ORDER — HYDROCHLOROTHIAZIDE 12.5 MG PO CAPS: 12.5000 mg | ORAL_CAPSULE | Freq: Every day | ORAL | 1 refills | Status: DC

## 2019-12-04 NOTE — Progress Notes (Signed)
Patient ID: Jenna Winters, female   DOB: 09/24/1976, 44 y.o.   MRN: [id number]   Virtual Visit via video Note  This visit type was conducted due to national recommendations for restrictions regarding the COVID-19 pandemic (e.g. social distancing).  This format is felt to be most appropriate for this patient at this time.  All issues noted in this document were discussed and addressed.  No physical exam was performed (except for noted visual exam findings with Video Visits).   I connected with Jenna Winters by a video enabled telemedicine application and verified that I am speaking with the correct person using two identifiers. Location patient: home Location provider: work Persons participating in the virtual visit: patient, provider  The limitations, risks, security and privacy concerns of performing an evaluation and management service by telephone and the availability of in person appointments have been discussed.  The patient expressed understanding and agreed to proceed.   Reason for visit: scheduled follow up.    HPI: She is doing well.  S/p bariatric surgery.  Has lost 141 pounds.  Feels better.  Energy is better.  Sleeping better.  Not going to the gym currently, but plans to start back.  Had covid.  Had to be quarantined.  No residual problems from covid.  Eating without difficulty.  Denies any problems with swallowing.  No acid reflux.  Off PPI.  No abdominal pain.  Bowels moving.  Occasionally may take citrucel.  She is off her cholesterol medication.  Off buspar.  zoloft working well.  Blood pressure running lower.  This am 101/72.  Discussed tapering blood pressure medication.  Working from home.  This is going well.  Planning to go tomorrow to get labs through [facility].     ROS: See pertinent positives and negatives per HPI.  Past Medical History:  Diagnosis Date  . Depression   . Hemorrhoid   . Hypertension   . Hyperthyroidism 2009   s/p ablation with resulting  hypothyroidism    Past Surgical History:  Procedure Laterality Date  . ABDOMINAL HYSTERECTOMY  08/2011  . BREAST BIOPSY Left 2015   core bx fibroadenoma  . COLONOSCOPY    . DILATION AND CURETTAGE OF UTERUS  12/2010  . DILATION AND CURETTAGE OF UTERUS  1995  . HEMORRHOID SURGERY  08/2013    Family History  Problem Relation Age of Onset  . Hyperlipidemia Father   . Hypertension Father   . Diabetes Father   . Hyperlipidemia Paternal Grandmother   . Hypertension Paternal Grandmother   . Diabetes Paternal Grandmother   . Hyperlipidemia Paternal Grandfather   . Hypertension Paternal Grandfather   . Diabetes Paternal Grandfather   . Lung cancer Paternal Grandfather   . COPD Paternal Grandfather        lung  . Breast cancer Neg Hx     SOCIAL HX: reviewed.    Current Outpatient Medications:  .  cholecalciferol (VITAMIN D) 1000 UNITS tablet, Take 1,000 Units by mouth daily., Disp: , Rfl:  .  hydrochlorothiazide (MICROZIDE) 12.5 MG capsule, Take 1 capsule (12.5 mg total) by mouth daily., Disp: 90 capsule, Rfl: 1 .  levothyroxine (SYNTHROID) 175 MCG tablet, TAKE 1 TABLET BY MOUTH  DAILY BEFORE BREAKFAST, Disp: 90 tablet, Rfl: 1 .  metoprolol succinate (TOPROL-XL) 50 MG 24 hr tablet, TAKE 1 TABLET BY MOUTH TWICE DAILY TAKE WITH OR IMMEDIATELY FOLLOWING MEALS, Disp: 180 tablet, Rfl: 1 .  sertraline (ZOLOFT) 50 MG tablet, Take 1 tablet (50 mg total)  by mouth daily., Disp: 90 tablet, Rfl: 1 .  triamcinolone cream (KENALOG) 0.1 %, Apply 1 application topically 2 (two) times daily., Disp: 30 g, Rfl: 1  EXAM:  VITALS per patient if applicable: weight:  591.6BWG, 101/72, 67  GENERAL: alert, oriented, appears well and in no acute distress  HEENT: atraumatic, conjunttiva clear, no obvious abnormalities on inspection of external nose and ears  NECK: normal movements of the head and neck  LUNGS: on inspection no signs of respiratory distress, breathing rate appears normal, no obvious gross  SOB, gasping or wheezing  CV: no obvious cyanosis  PSYCH/NEURO: pleasant and cooperative, no obvious depression or anxiety, speech and thought processing grossly intact  ASSESSMENT AND PLAN:  Discussed the following assessment and plan:  Essential hypertension, benign Blood pressure doing better.  Adjust medication.  Will continue metoprolol.  Change lisinopril/hctz to hctz 12.38m q day.  Follow pressures.  Send in readings.  Follow metabolic panel.   Fatty infiltration of liver Has adjusted her diet.  S/p bariatric surgery.  Has lost weight.  Follow liver function tests.    Hyperglycemia Low carb diet and exercise.  Follow met b and a1c.    Hypothyroidism (acquired) On thyroid replacement.  Follow tsh.   Stress Off buspar.  On zoloft.  Doing well on this medication.  Follow.     Meds ordered this encounter  Medications  . hydrochlorothiazide (MICROZIDE) 12.5 MG capsule    Sig: Take 1 capsule (12.5 mg total) by mouth daily.    Dispense:  90 capsule    Refill:  1  . sertraline (ZOLOFT) 50 MG tablet    Sig: Take 1 tablet (50 mg total) by mouth daily.    Dispense:  90 tablet    Refill:  1     I discussed the assessment and treatment plan with the patient. The patient was provided an opportunity to ask questions and all were answered. The patient agreed with the plan and demonstrated an understanding of the instructions.   The patient was advised to call back or seek an in-person evaluation if the symptoms worsen or if the condition fails to improve as anticipated.   CEinar Pheasant MD

## 2019-12-09 ENCOUNTER — Encounter: Payer: Self-pay | Admitting: Internal Medicine

## 2019-12-09 NOTE — Assessment & Plan Note (Signed)
On thyroid replacement.  Follow tsh.  

## 2019-12-09 NOTE — Assessment & Plan Note (Signed)
Off buspar.  On zoloft.  Doing well on this medication.  Follow.

## 2019-12-09 NOTE — Assessment & Plan Note (Signed)
Has adjusted her diet.  S/p bariatric surgery.  Has lost weight.  Follow liver function tests.

## 2019-12-09 NOTE — Assessment & Plan Note (Signed)
Low carb diet and exercise.  Follow met b and a1c.   

## 2019-12-09 NOTE — Assessment & Plan Note (Signed)
Blood pressure doing better.  Adjust medication.  Will continue metoprolol.  Change lisinopril/hctz to hctz 12.5mg  q day.  Follow pressures.  Send in readings.  Follow metabolic panel.

## 2019-12-11 ENCOUNTER — Telehealth: Payer: Self-pay | Admitting: Internal Medicine

## 2019-12-11 DIAGNOSIS — R739 Hyperglycemia, unspecified: Secondary | ICD-10-CM

## 2019-12-11 NOTE — Telephone Encounter (Signed)
Pt called and wanted to talk about lab orders that were placed

## 2019-12-11 NOTE — Telephone Encounter (Signed)
Labs that were needing to be drawn were done under Dr Jenel Lucks name with the exception of the A1C. Pt will send results once available. I have reordered just a A1C to be done per patient request.

## 2020-01-27 ENCOUNTER — Ambulatory Visit: Payer: Managed Care, Other (non HMO) | Attending: Internal Medicine

## 2020-01-27 DIAGNOSIS — Z23 Encounter for immunization: Secondary | ICD-10-CM | POA: Insufficient documentation

## 2020-01-27 NOTE — Progress Notes (Signed)
   Covid-19 Vaccination Clinic  Name:  Jenna Winters    MRN: [id number] DOB: 15-Nov-1976  01/27/2020  Jenna Winters was observed post Covid-19 immunization for 15 minutes without incident. She was provided with Vaccine Information Sheet and instruction to access the V-Safe system.   Jenna Winters was instructed to call 911 with any severe reactions post vaccine: Marland Kitchen Difficulty breathing  . Swelling of face and throat  . A fast heartbeat  . A bad rash all over body  . Dizziness and weakness   Immunizations Administered    Name Date Dose VIS Date Route   Pfizer COVID-19 Vaccine 01/27/2020  9:46 AM 0.3 mL 11/02/2019 Intramuscular   Manufacturer: [place]   Lot: [id number]   [place]: [id number]

## 2020-02-18 ENCOUNTER — Ambulatory Visit: Payer: Managed Care, Other (non HMO) | Attending: Internal Medicine

## 2020-02-18 DIAGNOSIS — Z23 Encounter for immunization: Secondary | ICD-10-CM

## 2020-02-18 NOTE — Progress Notes (Signed)
   Covid-19 Vaccination Clinic  Name:  Jenna Winters    MRN: [id number] DOB: May 31, 1976  02/18/2020  Jenna Winters was observed post Covid-19 immunization for 15 minutes without incident. She was provided with Vaccine Information Sheet and instruction to access the V-Safe system.   Jenna Winters was instructed to call 911 with any severe reactions post vaccine: Marland Kitchen Difficulty breathing  . Swelling of face and throat  . A fast heartbeat  . A bad rash all over body  . Dizziness and weakness   Immunizations Administered    Name Date Dose VIS Date Route   Pfizer COVID-19 Vaccine 02/18/2020 10:00 AM 0.3 mL 11/02/2019 Intramuscular   Manufacturer: [place]   Lot: [id number]   [place]: [id number]

## 2020-02-22 ENCOUNTER — Other Ambulatory Visit: Payer: Self-pay | Admitting: Internal Medicine

## 2020-03-14 ENCOUNTER — Encounter: Payer: Managed Care, Other (non HMO) | Admitting: Internal Medicine

## 2020-03-14 DIAGNOSIS — Z0289 Encounter for other administrative examinations: Secondary | ICD-10-CM

## 2020-04-01 ENCOUNTER — Other Ambulatory Visit: Payer: Self-pay | Admitting: Internal Medicine

## 2020-04-01 DIAGNOSIS — Z1231 Encounter for screening mammogram for malignant neoplasm of breast: Secondary | ICD-10-CM

## 2020-06-01 ENCOUNTER — Other Ambulatory Visit: Payer: Self-pay | Admitting: Internal Medicine

## 2020-08-13 ENCOUNTER — Other Ambulatory Visit: Payer: Self-pay | Admitting: Internal Medicine

## 2020-08-20 ENCOUNTER — Other Ambulatory Visit: Payer: Self-pay | Admitting: Internal Medicine

## 2020-08-21 ENCOUNTER — Encounter: Payer: Self-pay | Admitting: Internal Medicine

## 2020-08-24 MED ORDER — LEVOTHYROXINE SODIUM 150 MCG PO TABS: 150.0000 ug | ORAL_TABLET | Freq: Every day | ORAL | 2 refills | Status: DC

## 2020-08-24 NOTE — Telephone Encounter (Signed)
rx sent in for synthroid 166mcg #30 with 2 refills.

## 2020-09-10 ENCOUNTER — Other Ambulatory Visit: Payer: Self-pay

## 2020-09-10 MED ORDER — LEVOTHYROXINE SODIUM 150 MCG PO TABS: 150.0000 ug | ORAL_TABLET | Freq: Every day | ORAL | 2 refills | Status: DC

## 2020-09-10 NOTE — Telephone Encounter (Signed)
See my chart message. Please check with pharmacy.  It is showing that her rx has been sent in.  Thanks.

## 2020-09-15 ENCOUNTER — Encounter: Payer: Self-pay | Admitting: Internal Medicine

## 2020-09-15 NOTE — Telephone Encounter (Signed)
Please advise to refill patient ha snot had labs in one year. Metoprolol 50 mg XR.

## 2020-09-16 MED ORDER — METOPROLOL SUCCINATE ER 50 MG PO TB24: ORAL_TABLET | ORAL | 0 refills | Status: DC

## 2020-09-16 NOTE — Telephone Encounter (Signed)
Refilled medication x 1.  Needs a f/u appt with me.  Also  [facility] labs were ordered in 09/2019.  Needs to go by [facility] this week and have labs drawn.

## 2020-09-22 NOTE — Telephone Encounter (Signed)
LMTCB

## 2020-11-12 ENCOUNTER — Encounter: Payer: Self-pay | Admitting: Internal Medicine

## 2020-11-12 DIAGNOSIS — Z1152 Encounter for screening for COVID-19: Secondary | ICD-10-CM

## 2020-11-12 DIAGNOSIS — E039 Hypothyroidism, unspecified: Secondary | ICD-10-CM

## 2020-11-12 NOTE — Telephone Encounter (Signed)
Labs orders sent for [facility]

## 2020-11-14 LAB — SARS-COV-2 SEMI-QUANTITATIVE TOTAL ANTIBODY, SPIKE
SARS-CoV-2 Semi-Quant Total Ab: 1683 U/mL (ref <0.8)
SARS-CoV-2 Spike Ab Interp: POSITIVE

## 2020-11-14 LAB — TSH: TSH: 1.57 u[IU]/mL (ref 0.450–4.500)

## 2020-11-22 ENCOUNTER — Encounter: Payer: Self-pay | Admitting: Internal Medicine

## 2020-11-25 ENCOUNTER — Other Ambulatory Visit: Payer: Self-pay

## 2020-11-25 MED ORDER — HYDROCHLOROTHIAZIDE 12.5 MG PO CAPS: ORAL_CAPSULE | ORAL | 1 refills | Status: DC

## 2020-11-26 ENCOUNTER — Encounter: Payer: Self-pay | Admitting: Internal Medicine

## 2020-12-23 ENCOUNTER — Other Ambulatory Visit: Payer: Self-pay

## 2020-12-23 ENCOUNTER — Encounter: Payer: Self-pay | Admitting: Internal Medicine

## 2020-12-23 MED ORDER — LEVOTHYROXINE SODIUM 150 MCG PO TABS: 150.0000 ug | ORAL_TABLET | Freq: Every day | ORAL | 1 refills | Status: DC

## 2021-02-25 ENCOUNTER — Other Ambulatory Visit: Payer: Self-pay | Admitting: Internal Medicine

## 2021-02-25 ENCOUNTER — Encounter: Payer: Self-pay | Admitting: *Deleted

## 2021-03-16 ENCOUNTER — Encounter: Payer: Self-pay | Admitting: Internal Medicine

## 2021-03-16 NOTE — Telephone Encounter (Signed)
Ok to refill medication (toprol) with q day directions given this is how she is taking the medication.  Needs an appt scheduled with me.

## 2021-03-17 ENCOUNTER — Other Ambulatory Visit: Payer: Self-pay

## 2021-03-17 MED ORDER — METOPROLOL SUCCINATE ER 50 MG PO TB24: 50.0000 mg | ORAL_TABLET | Freq: Every day | ORAL | 0 refills | Status: DC

## 2021-03-17 NOTE — Telephone Encounter (Signed)
Toprol refilled with new directions. Appt scheduled with Dr Nicki Reaper for 03/23/21.

## 2021-03-23 ENCOUNTER — Other Ambulatory Visit: Payer: Self-pay

## 2021-03-23 ENCOUNTER — Encounter: Payer: Self-pay | Admitting: Internal Medicine

## 2021-03-23 ENCOUNTER — Ambulatory Visit: Payer: Managed Care, Other (non HMO) | Admitting: Internal Medicine

## 2021-03-23 VITALS — BP 116/62 | HR 71 | Temp 97.9°F | Ht 67.0 in | Wt 208.0 lb

## 2021-03-23 DIAGNOSIS — R739 Hyperglycemia, unspecified: Secondary | ICD-10-CM | POA: Diagnosis not present

## 2021-03-23 DIAGNOSIS — I10 Essential (primary) hypertension: Secondary | ICD-10-CM

## 2021-03-23 DIAGNOSIS — E7800 Pure hypercholesterolemia, unspecified: Secondary | ICD-10-CM | POA: Diagnosis not present

## 2021-03-23 DIAGNOSIS — Z1231 Encounter for screening mammogram for malignant neoplasm of breast: Secondary | ICD-10-CM

## 2021-03-23 DIAGNOSIS — E039 Hypothyroidism, unspecified: Secondary | ICD-10-CM

## 2021-03-23 DIAGNOSIS — Z9884 Bariatric surgery status: Secondary | ICD-10-CM

## 2021-03-23 DIAGNOSIS — F439 Reaction to severe stress, unspecified: Secondary | ICD-10-CM

## 2021-03-23 DIAGNOSIS — Z8616 Personal history of COVID-19: Secondary | ICD-10-CM

## 2021-03-23 DIAGNOSIS — K760 Fatty (change of) liver, not elsewhere classified: Secondary | ICD-10-CM

## 2021-03-23 NOTE — Progress Notes (Signed)
Patient ID: Jenna Winters, female   DOB: [birth year], 45 y.o.   MRN: [id number]   Subjective:    Patient ID: Jenna Winters, female    DOB: 02/22/1976, 45 y.o.   MRN: [id number]  HPI This visit occurred during the SARS-CoV-2 public health emergency.  Safety protocols were in place, including screening questions prior to the visit, additional usage of staff PPE, and extensive cleaning of exam room while observing appropriate contact time as indicated for disinfecting solutions.  Patient here for a scheduled follow up. She is s/p gastric surgery and has lost > 100 pounds.  She is sleeping better. Feels better.  Trying to stay active.  She is off prilosec. If she watches what she eats, she has no problems with swallowing.  No acid reflux.  No abdominal pain.  No chest pain or sob reported.  Bowels moving.  Is watching what she eats.  Not drinking sodas or sweet tea.  Handling stress.    Past Medical History:  Diagnosis Date  . Depression   . Hemorrhoid   . Hypertension   . Hyperthyroidism 2009   s/p ablation with resulting hypothyroidism   Past Surgical History:  Procedure Laterality Date  . ABDOMINAL HYSTERECTOMY  08/2011  . BREAST BIOPSY Left 2015   core bx fibroadenoma  . COLONOSCOPY    . DILATION AND CURETTAGE OF UTERUS  12/2010  . DILATION AND CURETTAGE OF UTERUS  1995  . HEMORRHOID SURGERY  08/2013   Family History  Problem Relation Age of Onset  . Hyperlipidemia Father   . Hypertension Father   . Diabetes Father   . Hyperlipidemia Paternal Grandmother   . Hypertension Paternal Grandmother   . Diabetes Paternal Grandmother   . Hyperlipidemia Paternal Grandfather   . Hypertension Paternal Grandfather   . Diabetes Paternal Grandfather   . Lung cancer Paternal Grandfather   . COPD Paternal Grandfather        lung  . Breast cancer Neg Hx    Social History   Socioeconomic History  . Marital status: Married    Spouse name: Not on file  . Number of children: 1  . Years  of education: Not on file  . Highest education level: Not on file  Occupational History  . Occupation: Buyer, retail: [employer]  Tobacco Use  . Smoking status: Never Smoker  . Smokeless tobacco: Never Used  Substance and Sexual Activity  . Alcohol use: Yes    Alcohol/week: 0.0 standard drinks    Comment: rare occasion  . Drug use: No  . Sexual activity: Not on file  Other Topics Concern  . Not on file  Social History Narrative  . Not on file   Social Determinants of Health   Financial Resource Strain: Not on file  Food Insecurity: Not on file  Transportation Needs: Not on file  Physical Activity: Not on file  Stress: Not on file  Social Connections: Not on file    Outpatient Encounter Medications as of 03/23/2021  Medication Sig  . cholecalciferol (VITAMIN D) 1000 UNITS tablet Take 2,000 Units by mouth daily.  . hydrochlorothiazide (MICROZIDE) 12.5 MG capsule TAKE 1 CAPSULE(12.5 MG) BY MOUTH DAILY  . levothyroxine (SYNTHROID) 150 MCG tablet Take 1 tablet (150 mcg total) by mouth daily before breakfast.  . metoprolol succinate (TOPROL-XL) 50 MG 24 hr tablet Take 1 tablet (50 mg total) by mouth daily. TAKE 1 TABLET BY MOUTH TWICE DAILY, WITH OR IMMEDIATELY FOLLOWING MEALS (Patient taking  differently: Take 50 mg by mouth daily. TAKE 1 TABLET BY MOUTH DAILY WITH OR IMMEDIATELY FOLLOWING MEALS)  . sertraline (ZOLOFT) 50 MG tablet TAKE 1 TABLET(50 MG) BY MOUTH DAILY.  Marland Kitchen triamcinolone cream (KENALOG) 0.1 % Apply 1 application topically 2 (two) times daily.   No facility-administered encounter medications on file as of 03/23/2021.    Review of Systems  Constitutional: Negative for appetite change and unexpected weight change.  HENT: Negative for congestion and sinus pressure.   Respiratory: Negative for cough, chest tightness and shortness of breath.   Cardiovascular: Negative for chest pain, palpitations and leg swelling.  Gastrointestinal: Negative for abdominal pain,  diarrhea, nausea and vomiting.  Genitourinary: Negative for difficulty urinating and dysuria.  Musculoskeletal: Negative for joint swelling and myalgias.  Skin: Negative for color change and rash.  Neurological: Negative for dizziness, light-headedness and headaches.  Psychiatric/Behavioral: Negative for agitation and dysphoric mood.       Objective:    Physical Exam Vitals reviewed.  Constitutional:      General: She is not in acute distress.    Appearance: Normal appearance.  HENT:     Head: Normocephalic and atraumatic.     Right Ear: External ear normal.     Left Ear: External ear normal.  Eyes:     General: No scleral icterus.       Right eye: No discharge.        Left eye: No discharge.     Conjunctiva/sclera: Conjunctivae normal.  Neck:     Thyroid: No thyromegaly.  Cardiovascular:     Rate and Rhythm: Normal rate and regular rhythm.  Pulmonary:     Effort: No respiratory distress.     Breath sounds: Normal breath sounds. No wheezing.  Abdominal:     General: Bowel sounds are normal.     Palpations: Abdomen is soft.     Tenderness: There is no abdominal tenderness.  Musculoskeletal:        General: No swelling or tenderness.     Cervical back: Neck supple. No tenderness.  Lymphadenopathy:     Cervical: No cervical adenopathy.  Skin:    Findings: No erythema or rash.  Neurological:     Mental Status: She is alert.  Psychiatric:        Mood and Affect: Mood normal.        Behavior: Behavior normal.     BP 116/62 (BP Location: Left Arm, Patient Position: Sitting, Cuff Size: Large)   Pulse 71   Temp 97.9 F (36.6 C) (Oral)   Ht 5' 7"  (1.702 m)   Wt 208 lb (94.3 kg)   SpO2 97%   BMI 32.58 kg/m  Wt Readings from Last 3 Encounters:  03/23/21 208 lb (94.3 kg)  12/04/19 237 lb 3.2 oz (107.6 kg)  01/18/19 (!) 349 lb (158.3 kg)     Lab Results  Component Value Date   WBC 9.9 01/16/2019   HGB 13.5 01/16/2019   HCT 40.2 01/16/2019   PLT 264 01/16/2019    GLUCOSE 123 (H) 12/19/2018   CHOL 115 07/06/2019   TRIG 104 07/06/2019   HDL 24 (A) 07/06/2019   LDLCALC 70 07/06/2019   ALT 38 (H) 01/16/2019   AST 27 01/16/2019   NA 138 12/19/2018   K 4.6 12/19/2018   CL 101 12/19/2018   CREATININE 0.79 12/19/2018   BUN 10 12/19/2018   CO2 23 12/19/2018   TSH 1.570 11/13/2020   HGBA1C 5.6 07/06/2019    MM  3D SCREEN BREAST BILATERAL  Result Date: 01/25/2019 CLINICAL DATA:  Screening. EXAM: DIGITAL SCREENING BILATERAL MAMMOGRAM WITH TOMO AND CAD COMPARISON:  Previous exam(s). ACR Breast Density Category b: There are scattered areas of fibroglandular density. FINDINGS: There are no findings suspicious for malignancy. Images were processed with CAD. IMPRESSION: No mammographic evidence of malignancy. A result letter of this screening mammogram will be mailed directly to the patient. RECOMMENDATION: Screening mammogram in one year. (Code:SM-B-01Y) BI-RADS CATEGORY  1: Negative. Electronically Signed   By: Nolon Nations M.D.   On: 01/25/2019 10:46       Assessment & Plan:   Problem List Items Addressed This Visit    Essential hypertension, benign - Primary    Continue metoprolol.  Blood pressure doing well.  Follow pressures.  Follow metabolic panel.       Relevant Orders   TSH   Basic metabolic panel   Fatty infiltration of liver    S/p bariatric surgery.  Has lost weight.  Check liver panel.       History of COVID-19    Request covid antibody check.       Relevant Orders   SARS-CoV-2 Semi-Quantitative Total Antibody, Spike   History of gastric bypass    S/p bariatric surgery.  Has done well. Lost weight.  Feels better.  Swallowing ok.  Check cbc and iron studies.       Relevant Orders   CBC with Differential/Platelet   Ferritin   Hypercholesterolemia    Has adjusted her diet.  Lost weight.  Check lipid panel.       Relevant Orders   Lipid panel   Hyperglycemia    Has adjusted her diet.  Lost weight.  Check met b and a1c.        Relevant Orders   Hemoglobin A1c   Hypothyroidism (acquired)    On thyroid replacement.  Follow tsh.        Stress    Handling stress.  Continue zoloft.        Other Visit Diagnoses    Encounter for screening mammogram for malignant neoplasm of breast       Relevant Orders   MM 3D SCREEN BREAST BILATERAL       Einar Pheasant, MD

## 2021-03-29 ENCOUNTER — Encounter: Payer: Self-pay | Admitting: Internal Medicine

## 2021-03-29 ENCOUNTER — Telehealth: Payer: Self-pay | Admitting: Internal Medicine

## 2021-03-29 NOTE — Telephone Encounter (Signed)
Jenna Winters is overdue mammogram.  (last 2020).  Need to schedule. Order in epic.

## 2021-03-29 NOTE — Assessment & Plan Note (Signed)
S/p bariatric surgery.  Has done well. Lost weight.  Feels better.  Swallowing ok.  Check cbc and iron studies.  °

## 2021-03-29 NOTE — Assessment & Plan Note (Signed)
Handling stress.  Continue zoloft.

## 2021-03-29 NOTE — Assessment & Plan Note (Signed)
S/p bariatric surgery.  Has lost weight.  Check liver panel.

## 2021-03-29 NOTE — Assessment & Plan Note (Signed)
Request covid antibody check.

## 2021-03-29 NOTE — Assessment & Plan Note (Signed)
On thyroid replacement.  Follow tsh.  

## 2021-03-29 NOTE — Assessment & Plan Note (Signed)
Has adjusted her diet.  Lost weight.  Check lipid panel.  

## 2021-03-29 NOTE — Assessment & Plan Note (Signed)
Has adjusted her diet.  Lost weight.  Check met b and a1c.

## 2021-03-29 NOTE — Assessment & Plan Note (Signed)
Continue metoprolol.  Blood pressure doing well.  Follow pressures.  Follow metabolic panel.  

## 2021-03-31 NOTE — Telephone Encounter (Signed)
Mammo scheduled. My chart sent

## 2021-04-07 ENCOUNTER — Ambulatory Visit
Admission: RE | Admit: 2021-04-07 | Discharge: 2021-04-07 | Disposition: A | Discharge status: Home / Self Care | Payer: Managed Care, Other (non HMO) | Source: Ambulatory Visit | Attending: Internal Medicine | Admitting: Internal Medicine

## 2021-04-07 ENCOUNTER — Other Ambulatory Visit: Payer: Self-pay

## 2021-04-07 DIAGNOSIS — Z1231 Encounter for screening mammogram for malignant neoplasm of breast: Secondary | ICD-10-CM | POA: Insufficient documentation

## 2021-05-17 IMAGING — MG MM DIGITAL SCREENING BILAT W/ TOMO AND CAD
8 series · 8 of 24 positions shown · non-contrast
Comparison: Previous exam(s).

CLINICAL DATA: Screening.

EXAM:
DIGITAL SCREENING BILATERAL MAMMOGRAM WITH TOMOSYNTHESIS AND CAD
TECHNIQUE: Bilateral screening digital craniocaudal and mediolateral oblique
mammograms were obtained. Bilateral screening digital breast
tomosynthesis was performed. The images were evaluated with
computer-aided detection.

[R CC synth-2D]
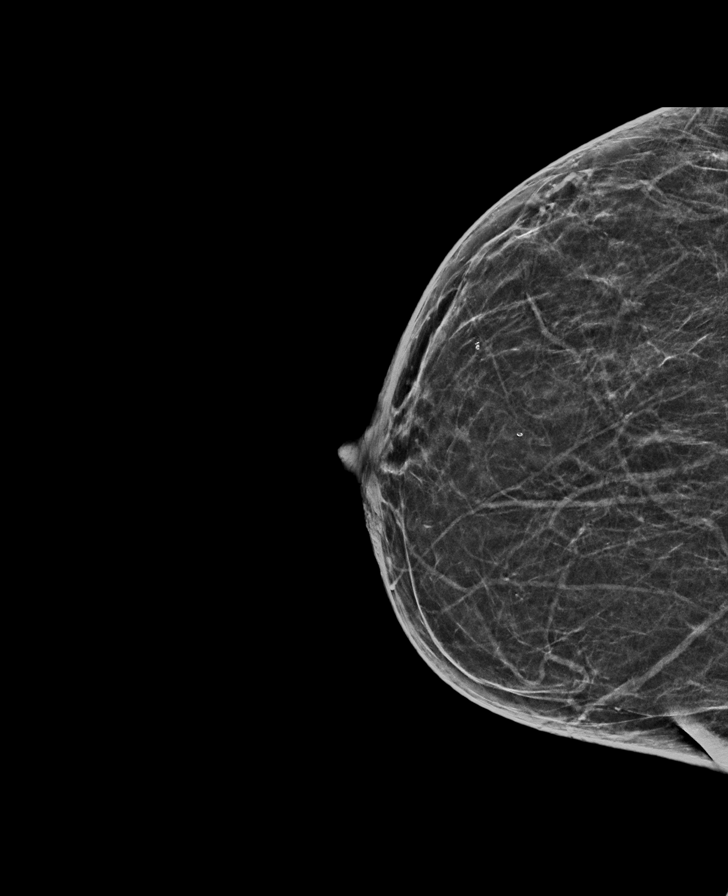

[L MLO synth-2D]
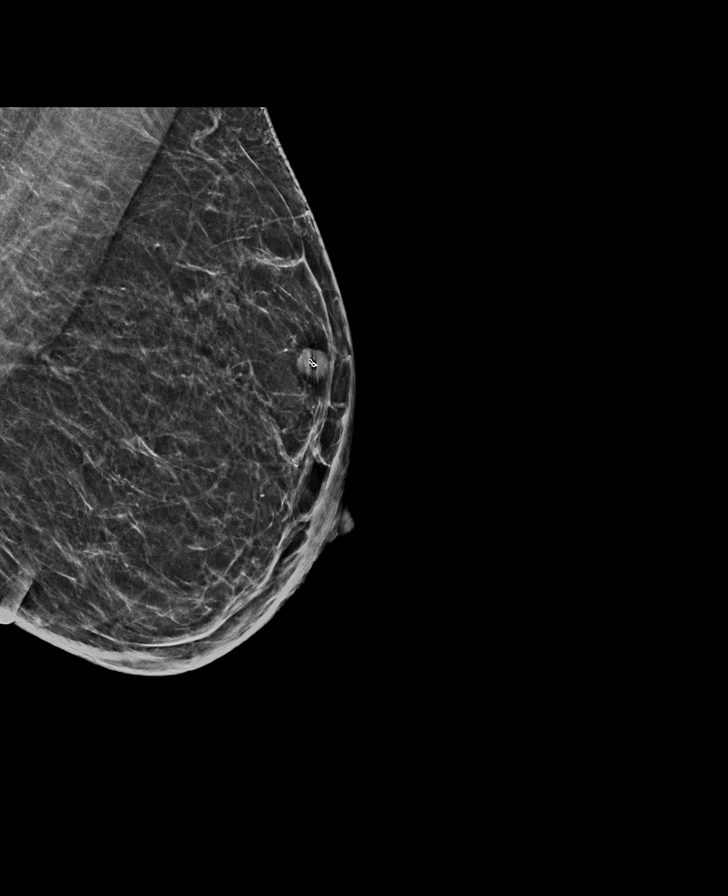

[R MLO synth-2D]
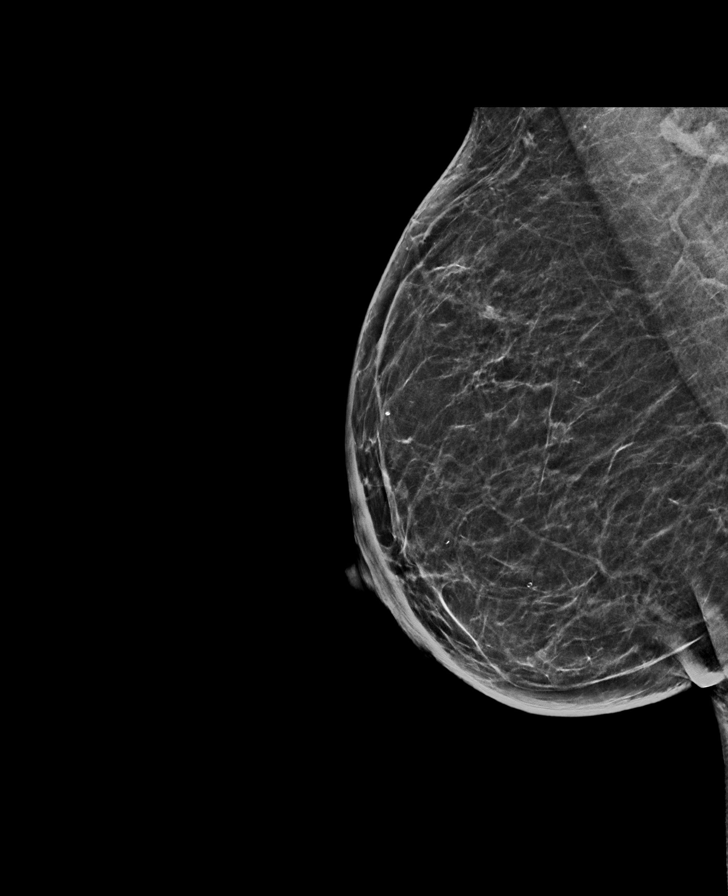

[L CC synth-2D]
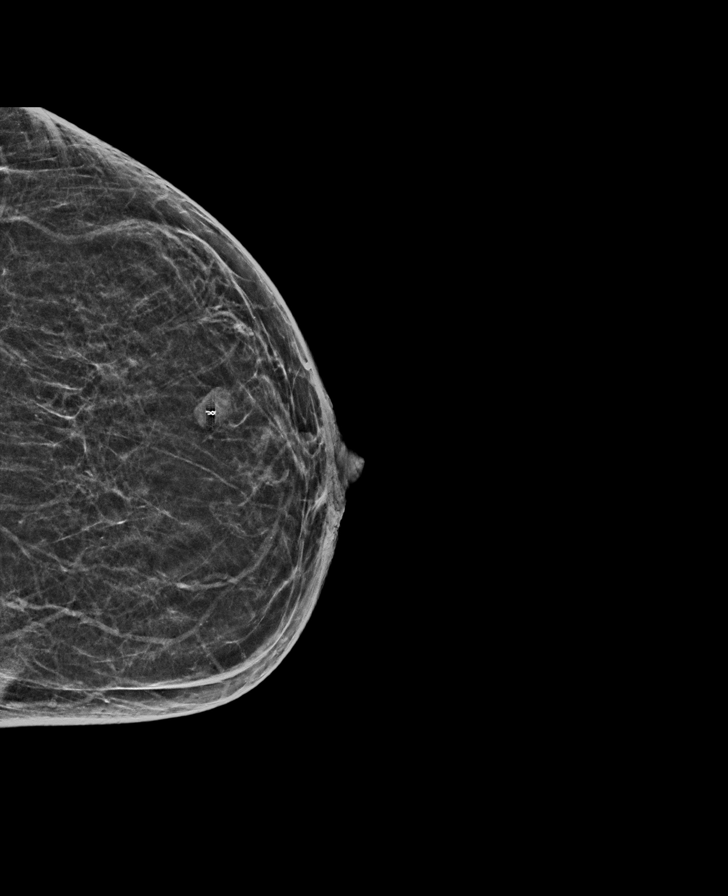

[R CC tomo · tomo slice 27/53.0]
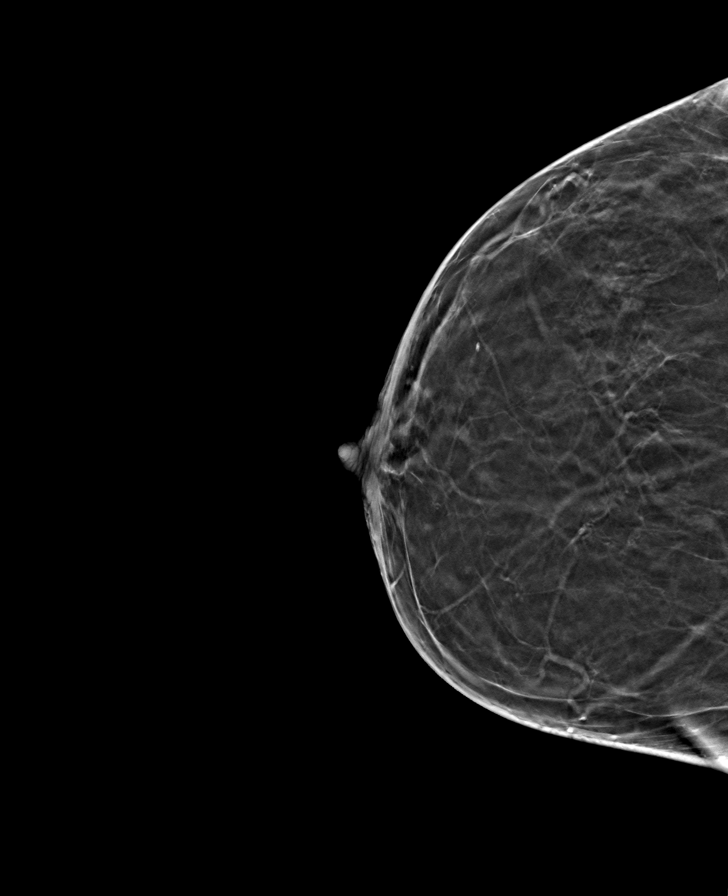

[R MLO tomo · tomo slice 31/62.0]
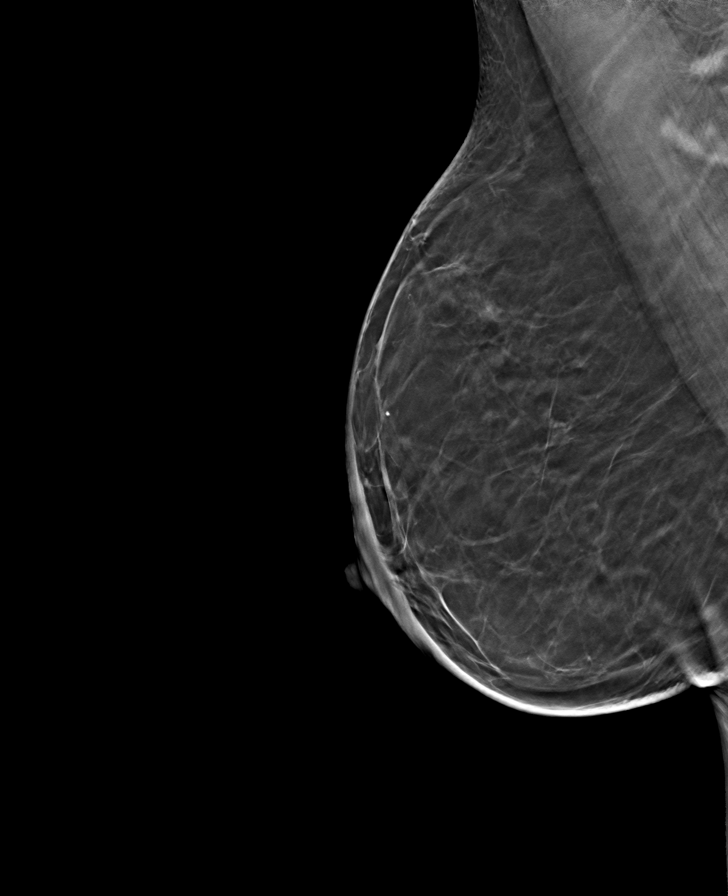

[L MLO tomo · tomo slice 29/57.0]
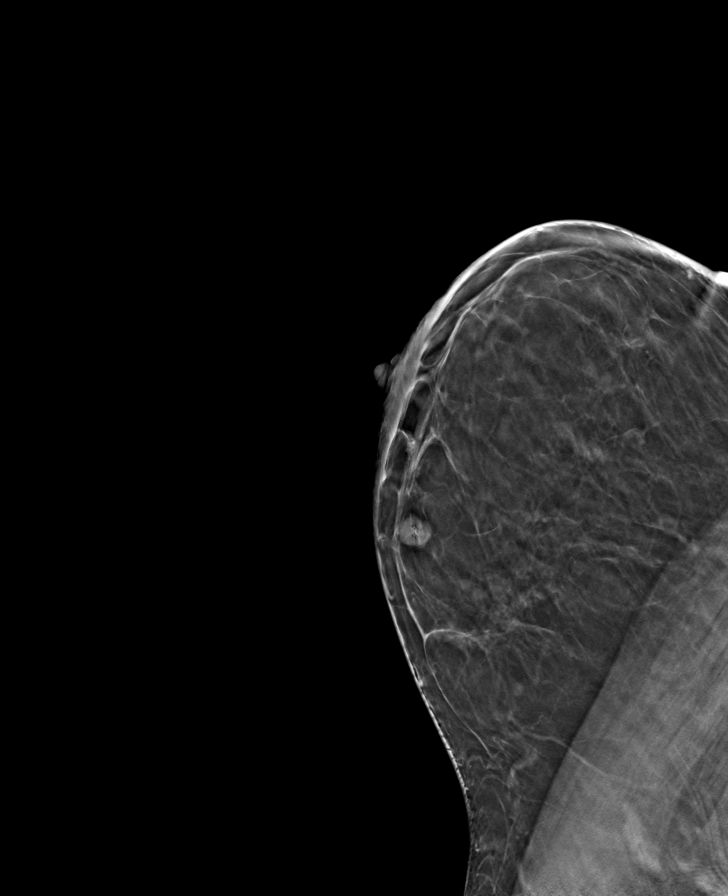

[L CC tomo · tomo slice 27/52.0]
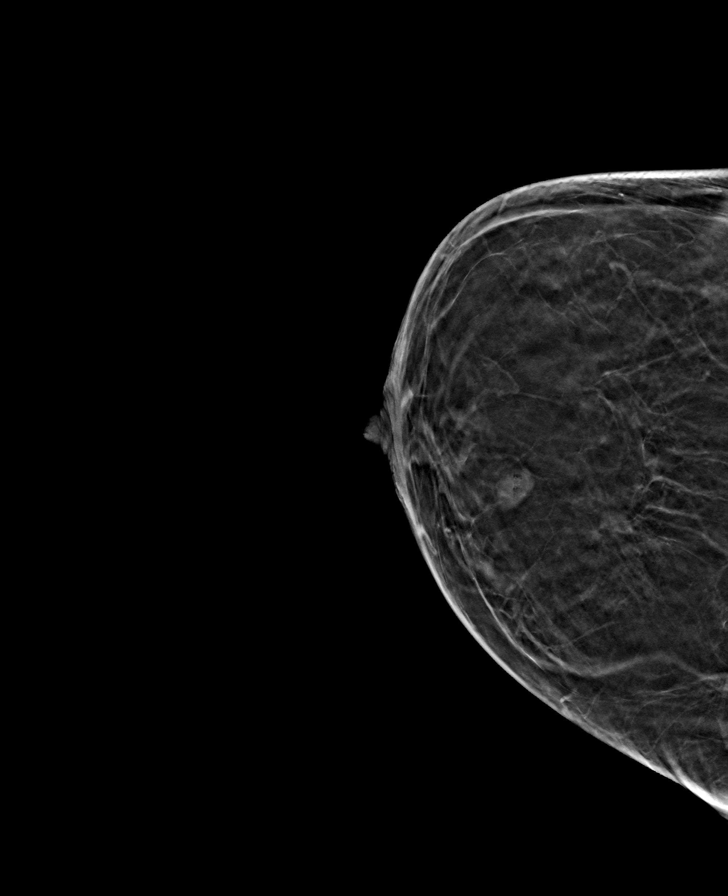

[8 of 24 positions shown; findings below may reference images not displayed]

ACR Breast Density Category b: There are scattered areas of
fibroglandular density.
FINDINGS: There are no findings suspicious for malignancy. The images were
evaluated with computer-aided detection.
IMPRESSION: No mammographic evidence of malignancy. A result letter of this
screening mammogram will be mailed directly to the patient.

RECOMMENDATION:
Screening mammogram in one year. (Code:WJ-I-BG6)

BI-RADS CATEGORY  1: Negative.

## 2021-05-26 ENCOUNTER — Other Ambulatory Visit: Payer: Self-pay | Admitting: Internal Medicine

## 2021-05-26 ENCOUNTER — Encounter: Payer: Self-pay | Admitting: Internal Medicine

## 2021-05-27 ENCOUNTER — Other Ambulatory Visit: Payer: Self-pay

## 2021-05-27 MED ORDER — HYDROCHLOROTHIAZIDE 12.5 MG PO CAPS: ORAL_CAPSULE | ORAL | 1 refills | Status: DC

## 2021-05-28 LAB — LIPID PANEL
Chol/HDL Ratio: 3.5 ratio (ref 0.0–4.4)
Cholesterol, Total: 173 mg/dL (ref 100–199)
HDL: 49 mg/dL (ref >39)
LDL Chol Calc (NIH): 104 mg/dL — ABNORMAL HIGH (ref 0–99)
Triglycerides: 110 mg/dL (ref 0–149)
VLDL Cholesterol Cal: 20 mg/dL (ref 5–40)

## 2021-05-28 LAB — BASIC METABOLIC PANEL
BUN/Creatinine Ratio: 25 — ABNORMAL HIGH (ref 9–23)
BUN: 18 mg/dL (ref 6–24)
CO2: 27 mmol/L (ref 20–29)
Calcium: 9.4 mg/dL (ref 8.7–10.2)
Chloride: 102 mmol/L (ref 96–106)
Creatinine, Ser: 0.72 mg/dL (ref 0.57–1.00)
Glucose: 84 mg/dL (ref 65–99)
Potassium: 4 mmol/L (ref 3.5–5.2)
Sodium: 142 mmol/L (ref 134–144)
eGFR: 105 mL/min/{1.73_m2} (ref >59)

## 2021-05-28 LAB — CBC WITH DIFFERENTIAL/PLATELET
Basophils Absolute: 0 10*3/uL (ref 0.0–0.2)
Basos: 1 %
EOS (ABSOLUTE): 0.2 10*3/uL (ref 0.0–0.4)
Eos: 4 %
Hematocrit: 41.8 % (ref 34.0–46.6)
Hemoglobin: 13.7 g/dL (ref 11.1–15.9)
Immature Grans (Abs): 0 10*3/uL (ref 0.0–0.1)
Immature Granulocytes: 0 %
Lymphocytes Absolute: 1.7 10*3/uL (ref 0.7–3.1)
Lymphs: 39 %
MCH: 30.5 pg (ref 26.6–33.0)
MCHC: 32.8 g/dL (ref 31.5–35.7)
MCV: 93 fL (ref 79–97)
Monocytes Absolute: 0.4 10*3/uL (ref 0.1–0.9)
Monocytes: 8 %
Neutrophils Absolute: 2.1 10*3/uL (ref 1.4–7.0)
Neutrophils: 48 %
Platelets: 170 10*3/uL (ref 150–450)
RBC: 4.49 x10E6/uL (ref 3.77–5.28)
RDW: 11.9 % (ref 11.7–15.4)
WBC: 4.4 10*3/uL (ref 3.4–10.8)

## 2021-05-28 LAB — SARS-COV-2 SPIKE AB DILUTION: SARS-CoV-2 Spike Ab Dilution: 8828 U/mL (ref <0.8)

## 2021-05-28 LAB — TSH: TSH: 1.4 u[IU]/mL (ref 0.450–4.500)

## 2021-05-28 LAB — HEMOGLOBIN A1C
Est. average glucose Bld gHb Est-mCnc: 94 mg/dL
Hgb A1c MFr Bld: 4.9 % (ref 4.8–5.6)

## 2021-05-28 LAB — FERRITIN: Ferritin: 175 ng/mL — ABNORMAL HIGH (ref 15–150)

## 2021-05-28 LAB — SARS-COV-2 SEMI-QUANTITATIVE TOTAL ANTIBODY, SPIKE: SARS-CoV-2 Spike Ab Interp: POSITIVE

## 2021-05-29 ENCOUNTER — Telehealth: Payer: Self-pay | Admitting: Internal Medicine

## 2021-05-29 DIAGNOSIS — R7989 Other specified abnormal findings of blood chemistry: Secondary | ICD-10-CM

## 2021-05-29 NOTE — Progress Notes (Signed)
Left message for patient to return call back for lab results.

## 2021-05-29 NOTE — Telephone Encounter (Signed)
Orders placed for f/u labs to be drawn at [facility].

## 2021-06-19 ENCOUNTER — Encounter: Payer: Self-pay | Admitting: Internal Medicine

## 2021-06-19 ENCOUNTER — Other Ambulatory Visit: Payer: Self-pay | Admitting: Internal Medicine

## 2021-06-22 ENCOUNTER — Other Ambulatory Visit: Payer: Self-pay

## 2021-06-22 MED ORDER — METOPROLOL SUCCINATE ER 50 MG PO TB24: 50.0000 mg | ORAL_TABLET | Freq: Every day | ORAL | 0 refills | Status: DC

## 2021-06-22 MED ORDER — LEVOTHYROXINE SODIUM 150 MCG PO TABS: 150.0000 ug | ORAL_TABLET | Freq: Every day | ORAL | 1 refills | Status: DC

## 2021-06-24 ENCOUNTER — Telehealth: Payer: Self-pay | Admitting: Internal Medicine

## 2021-06-24 MED ORDER — METOPROLOL SUCCINATE ER 50 MG PO TB24: 50.0000 mg | ORAL_TABLET | Freq: Every day | ORAL | 0 refills | Status: DC

## 2021-06-24 NOTE — Telephone Encounter (Signed)
[facility] in [place] called. There are 2 sets of directions on metoprolol succinate (TOPROL-XL) 50 MG 24 hr tablet. Please call Melanie or resend with 1 set of directions.

## 2021-06-24 NOTE — Telephone Encounter (Signed)
Please call and confirm with pt that she is taking q day and not bid.  (Per note, was taking q day).

## 2021-06-24 NOTE — Addendum Note (Signed)
Addended by: Elpidio Galea T on: 06/24/2021 02:18 PM   Modules accepted: Orders

## 2021-06-24 NOTE — Telephone Encounter (Signed)
Patient is taking the once per day. Rx has been resent to pharmacy. FYI

## 2021-06-25 ENCOUNTER — Other Ambulatory Visit: Payer: Self-pay

## 2021-06-25 ENCOUNTER — Ambulatory Visit: Payer: Managed Care, Other (non HMO) | Admitting: Dermatology

## 2021-06-25 DIAGNOSIS — Z85828 Personal history of other malignant neoplasm of skin: Secondary | ICD-10-CM

## 2021-06-25 DIAGNOSIS — L821 Other seborrheic keratosis: Secondary | ICD-10-CM

## 2021-06-25 DIAGNOSIS — L409 Psoriasis, unspecified: Secondary | ICD-10-CM

## 2021-06-25 DIAGNOSIS — D485 Neoplasm of uncertain behavior of skin: Secondary | ICD-10-CM | POA: Diagnosis not present

## 2021-06-25 DIAGNOSIS — L578 Other skin changes due to chronic exposure to nonionizing radiation: Secondary | ICD-10-CM

## 2021-06-25 DIAGNOSIS — L814 Other melanin hyperpigmentation: Secondary | ICD-10-CM

## 2021-06-25 DIAGNOSIS — D1801 Hemangioma of skin and subcutaneous tissue: Secondary | ICD-10-CM

## 2021-06-25 DIAGNOSIS — Z1283 Encounter for screening for malignant neoplasm of skin: Secondary | ICD-10-CM | POA: Diagnosis not present

## 2021-06-25 DIAGNOSIS — D229 Melanocytic nevi, unspecified: Secondary | ICD-10-CM

## 2021-06-25 HISTORY — DX: Melanocytic nevi, unspecified: D22.9

## 2021-06-25 MED ORDER — ENSTILAR 0.005-0.064 % EX FOAM
CUTANEOUS | 2 refills | Status: DC
Start: 2021-06-25 — End: 2024-08-14

## 2021-06-25 NOTE — Patient Instructions (Addendum)
Continue Enstilar once daily at scalp as needed. Avoid applying to face, groin, and axilla. Use as directed. Risk of skin atrophy with long-term use reviewed.   Topical steroids (such as triamcinolone, fluocinolone, fluocinonide, mometasone, clobetasol, halobetasol, betamethasone, hydrocortisone) can cause thinning and lightening of the skin if they are used for too long in the same area. Your physician has selected the right strength medicine for your problem and area affected on the body. Please use your medication only as directed by your physician to prevent side effects.   Recommend taking Heliocare sun protection supplement daily in sunny weather for additional sun protection. For maximum protection on the sunniest days, you can take up to 2 capsules of regular Heliocare OR take 1 capsule of Heliocare Ultra. For prolonged exposure (such as a full day in the sun), you can repeat your dose of the supplement 4 hours after your first dose. Heliocare can be purchased at [facility] or at [email or web address].    Melanoma ABCDEs  Melanoma is the most dangerous type of skin cancer, and is the leading cause of death from skin disease.  You are more likely to develop melanoma if you: Have light-colored skin, light-colored eyes, or red or blond hair Spend a lot of time in the sun Tan regularly, either outdoors or in a tanning bed Have had blistering sunburns, especially during childhood Have a close family member who has had a melanoma Have atypical moles or large birthmarks  Early detection of melanoma is key since treatment is typically straightforward and cure rates are extremely high if we catch it early.   The first sign of melanoma is often a change in a mole or a new dark spot.  The ABCDE system is a way of remembering the signs of melanoma.  A for asymmetry:  The two halves do not match. B for border:  The edges of the growth are irregular. C for color:  A mixture of colors are  present instead of an even brown color. D for diameter:  Melanomas are usually (but not always) greater than 41m - the size of a pencil eraser. E for evolution:  The spot keeps changing in size, shape, and color.  Please check your skin once per month between visits. You can use a small mirror in front and a large mirror behind you to keep an eye on the back side or your body.   If you see any new or changing lesions before your next follow-up, please call to schedule a visit.  Please continue daily skin protection including broad spectrum sunscreen SPF 30+ to sun-exposed areas, reapplying every 2 hours as needed when you're outdoors.    If you have any questions or concerns for your doctor, please call our main line at [phone] press option 4 to reach your doctor's medical assistant. If no one answers, please leave a voicemail as directed and we will return your call as soon as possible. Messages left after 4 pm will be answered the following business day.   You may also send [place] message via [place] We typically respond to MyChart messages within 1-2 business days.  For prescription refills, please ask your pharmacy to contact our office. Our fax number is [phone]  If you have an urgent issue when the clinic is closed that cannot wait until the next business day, you can page your doctor at the number below.    Please note that while we do our best to be available  for urgent issues outside of office hours, we are not available 24/7.   If you have an urgent issue and are unable to reach [place], you may choose to seek medical care at your doctor's office, retail clinic, urgent care center, or emergency room.  If you have a medical emergency, please immediately call 911 or go to the emergency department.  Pager Numbers  - Dr. Nehemiah Massed: [phone]  - Dr. Laurence Ferrari: [phone]  - Dr. Nicole Kindred: [phone]  In the event of inclement weather, please call our main line at  [phone] for an update on the status of any delays or closures.  Dermatology Medication Tips: Please keep the boxes that topical medications come in in order to help keep track of the instructions about where and how to use these. Pharmacies typically print the medication instructions only on the boxes and not directly on the medication tubes.   If your medication is too expensive, please contact our office at [phone] option 4 or send [place] a message through [place].   We are unable to tell what your co-pay for medications will be in advance as this is different depending on your insurance coverage. However, we may be able to find a substitute medication at lower cost or fill out paperwork to get insurance to cover a needed medication.   If a prior authorization is required to get your medication covered by your insurance company, please allow [place] 1-2 business days to complete this process.  Drug prices often vary depending on where the prescription is filled and some pharmacies may offer cheaper prices.  The website www.goodrx.com contains coupons for medications through different pharmacies. The prices here do not account for what the cost may be with help from insurance (it may be cheaper with your insurance), but the website can give you the price if you did not use any insurance.  - You can print the associated coupon and take it with your prescription to the pharmacy.  - You may also stop by our office during regular business hours and pick up a GoodRx coupon card.  - If you need your prescription sent electronically to a different pharmacy, notify our office through [facility] or by phone at [phone] option 4.

## 2021-06-25 NOTE — Progress Notes (Signed)
Follow-Up Visit   Subjective  Jenna Winters is a 45 y.o. female who presents for the following: TBSE (Patient here for full body skin exam and skin cancer screening. Patient with hx of BCC, no new or changing spots she is aware of today. ).  Unable to find pathology in [place] or [place]. Was able to find note in Care Everywhere from [facility] when patient had Moh's to treat BCC at left nasal supra tip on 01/29/2019.  The following portions of the chart were reviewed this encounter and updated as appropriate:   Tobacco  Allergies  Meds  Problems  Med Hx  Surg Hx  Fam Hx      Review of Systems:  No other skin or systemic complaints except as noted in HPI or Assessment and Plan.  Objective  Well appearing patient in no apparent distress; mood and affect are within normal limits.  A full examination was performed including scalp, head, eyes, ears, nose, lips, neck, chest, axillae, abdomen, back, buttocks, bilateral upper extremities, bilateral lower extremities, hands, feet, fingers, toes, fingernails, and toenails. All findings within normal limits unless otherwise noted below.  scalp Scaly pink plaques occipital scalp  Right Inner Thigh x 3 R/o irritated SK vs Nevus vs acrochordon at right inner thigh x 3 R/o irritated SK vs acrochordon at left axilla  Right Posterior Thigh 0.3cm slightly irregular dark brown papule R/o Atypia   Assessment & Plan  Psoriasis scalp  Chronic condition with duration or expected duration over one year.   Psoriasis is a chronic non-curable, but treatable genetic/hereditary disease that may have other systemic features affecting other organ systems such as joints (Psoriatic Arthritis). It is associated with an increased risk of inflammatory bowel disease, heart disease, non-alcoholic fatty liver disease, and depression.    No joint pain.   Continue Enstilar once daily at scalp as needed. Avoid applying to face, groin, and axilla. Use as directed.  Risk of skin atrophy with long-term use reviewed.   Topical steroids (such as triamcinolone, fluocinolone, fluocinonide, mometasone, clobetasol, halobetasol, betamethasone, hydrocortisone) can cause thinning and lightening of the skin if they are used for too long in the same area. Your physician has selected the right strength medicine for your problem and area affected on the body. Please use your medication only as directed by your physician to prevent side effects.    Calcipotriene-Betameth Diprop (ENSTILAR) 0.005-0.064 % FOAM - scalp Apply to scalp daily as needed for psoriasis. Avoid applying to face, groin, and axilla. Use as directed. Risk of skin atrophy with long-term use reviewed.  Neoplasm of uncertain behavior of skin (2) Right Inner Thigh x 3  Right Posterior Thigh  Epidermal / dermal shaving  Lesion diameter (cm):  0.3 Informed consent: discussed and consent obtained   Timeout: patient name, date of birth, surgical site, and procedure verified   Patient was prepped and draped in usual sterile fashion: area prepped with isopropyl alcohol. Anesthesia: the lesion was anesthetized in a standard fashion   Local anesthetic: 0.75% bupivicaine. Instrument used: DermaBlade   Hemostasis achieved with: aluminum chloride   Outcome: patient tolerated procedure well   Post-procedure details: wound care instructions given   Additional details:  Mupirocin and a bandage applied  Will plan biopsies to right inner thigh and left axilla   Related Procedures Anatomic Pathology Report  Lentigines - Scattered tan macules - Due to sun exposure - Benign-appering, observe - Recommend daily broad spectrum sunscreen SPF 30+ to sun-exposed areas, reapply every  2 hours as needed. - Call for any changes  Seborrheic Keratoses - Stuck-on, waxy, tan-brown papules and/or plaques  - Benign-appearing - Discussed benign etiology and prognosis. - Observe - Call for any changes  Melanocytic  Nevi - Tan-brown and/or pink-flesh-colored symmetric macules and papules - Benign appearing on exam today - Observation - Call clinic for new or changing moles - Recommend daily use of broad spectrum spf 30+ sunscreen to sun-exposed areas.   Hemangiomas - Red papules - Discussed benign nature - Observe - Call for any changes  Actinic Damage - Chronic condition, secondary to cumulative UV/sun exposure - diffuse scaly erythematous macules with underlying dyspigmentation - Recommend daily broad spectrum sunscreen SPF 30+ to sun-exposed areas, reapply every 2 hours as needed.  - Staying in the shade or wearing long sleeves, sun glasses (UVA+UVB protection) and wide brim hats (4-inch brim around the entire circumference of the hat) are also recommended for sun protection.  - Call for new or changing lesions.  History of Basal Cell Carcinoma of the Skin - No evidence of recurrence today at left nasal supratip - Recommend regular full body skin exams - Recommend daily broad spectrum sunscreen SPF 30+ to sun-exposed areas, reapply every 2 hours as needed.  - Call if any new or changing lesions are noted between office visits  Skin cancer screening performed today.  Return in about 6 months (around 12/26/2021) for TBSE, next available for biopsies.  Graciella Belton, RMA, am acting as scribe for Forest Gleason, MD .  Documentation: I have reviewed the above documentation for accuracy and completeness, and I agree with the above.  Forest Gleason, MD

## 2021-07-06 ENCOUNTER — Encounter: Payer: Managed Care, Other (non HMO) | Admitting: Internal Medicine

## 2021-07-07 ENCOUNTER — Telehealth: Payer: Self-pay

## 2021-07-07 LAB — ANATOMIC PATHOLOGY REPORT

## 2021-07-07 NOTE — Telephone Encounter (Signed)
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 (No atypical cells appreciated on histology)--> excision with 4 mm margins  Spoke with patient. She is in agreement with plan.   MAs please call to schedule. Thank you!

## 2021-07-22 ENCOUNTER — Encounter: Payer: Managed Care, Other (non HMO) | Admitting: Dermatology

## 2021-08-06 ENCOUNTER — Ambulatory Visit: Payer: Managed Care, Other (non HMO) | Admitting: Dermatology

## 2021-08-06 ENCOUNTER — Other Ambulatory Visit: Payer: Self-pay

## 2021-08-06 ENCOUNTER — Other Ambulatory Visit: Payer: Self-pay | Admitting: Dermatology

## 2021-08-06 DIAGNOSIS — A630 Anogenital (venereal) warts: Secondary | ICD-10-CM

## 2021-08-06 DIAGNOSIS — D485 Neoplasm of uncertain behavior of skin: Secondary | ICD-10-CM

## 2021-08-06 DIAGNOSIS — D225 Melanocytic nevi of trunk: Secondary | ICD-10-CM

## 2021-08-06 NOTE — Patient Instructions (Signed)
If you have any questions or concerns for your doctor, please call our main line at [phone] and press option 4 to reach your doctor's medical assistant. If no one answers, please leave a voicemail as directed and we will return your call as soon as possible. Messages left after 4 pm will be answered the following business day.   You may also send us a message via MyChart. We typically respond to MyChart messages within 1-2 business days.  For prescription refills, please ask your pharmacy to contact our office. Our fax number is [phone].  If you have an urgent issue when the clinic is closed that cannot wait until the next business day, you can page your doctor at the number below.    Please note that while we do our best to be available for urgent issues outside of office hours, we are not available 24/7.   If you have an urgent issue and are unable to reach us, you may choose to seek medical care at your doctor's office, retail clinic, urgent care center, or emergency room.  If you have a medical emergency, please immediately call 911 or go to the emergency department.  Pager Numbers  - Dr. Kowalski: [phone]  - Dr. Moye: [phone]  - Dr. Stewart: [phone]  In the event of inclement weather, please call our main line at [phone] for an update on the status of any delays or closures.  Dermatology Medication Tips: Please keep the boxes that topical medications come in in order to help keep track of the instructions about where and how to use these. Pharmacies typically print the medication instructions only on the boxes and not directly on the medication tubes.   If your medication is too expensive, please contact our office at [phone] option 4 or send us a message through MyChart.   We are unable to tell what your co-pay for medications will be in advance as this is different depending on your insurance coverage. However, we may be able to find a substitute  medication at lower cost or fill out paperwork to get insurance to cover a needed medication.   If a prior authorization is required to get your medication covered by your insurance company, please allow us 1-2 business days to complete this process.  Drug prices often vary depending on where the prescription is filled and some pharmacies may offer cheaper prices.  The website www.goodrx.com contains coupons for medications through different pharmacies. The prices here do not account for what the cost may be with help from insurance (it may be cheaper with your insurance), but the website can give you the price if you did not use any insurance.  - You can print the associated coupon and take it with your prescription to the pharmacy.  - You may also stop by our office during regular business hours and pick up a GoodRx coupon card.  - If you need your prescription sent electronically to a different pharmacy, notify our office through [place] [facility] MyChart or by phone at [phone] option 4.   Wound Care Instructions  Cleanse wound gently with soap and water once a day then pat dry with clean gauze. Apply a thing coat of Petrolatum (petroleum jelly, "Vaseline") over the wound (unless you have an allergy to this). We recommend that you use a new, sterile tube of Vaseline. Do not pick or remove scabs. Do not remove the yellow or white "healing tissue" from the base of the wound.  Cover the   wound with fresh, clean, nonstick gauze and secure with paper tape. You may use Band-Aids in place of gauze and tape if the would is small enough, but would recommend trimming much of the tape off as there is often too much. Sometimes Band-Aids can irritate the skin.  You should call the office for your biopsy report after 1 week if you have not already been contacted.  If you experience any problems, such as abnormal amounts of bleeding, swelling, significant bruising, significant pain, or evidence of infection,  please call the office immediately.  FOR ADULT SURGERY PATIENTS: If you need something for pain relief you may take 1 extra strength Tylenol (acetaminophen) AND 2 Ibuprofen (200mg each) together every 4 hours as needed for pain. (do not take these if you are allergic to them or if you have a reason you should not take them.) Typically, you may only need pain medication for 1 to 3 days.    

## 2021-08-06 NOTE — Progress Notes (Signed)
Follow-Up Visit   Subjective  Jenna Winters is a 45 y.o. female who presents for the following: irritated skin lesions (Of the L axilla and R thigh - patient would like removed today.).  The following portions of the chart were reviewed this encounter and updated as appropriate:   Tobacco  Allergies  Meds  Problems  Med Hx  Surg Hx  Fam Hx      Review of Systems:  No other skin or systemic complaints except as noted in HPI or Assessment and Plan.  Objective  Well appearing patient in no apparent distress; mood and affect are within normal limits.  A focused examination was performed including the B/L leg and L axilla. Relevant physical exam findings are noted in the Assessment and Plan.  Left Axilla "F" 0.3 cm Brown papule  R thigh distal "A" 0.4 cm Brown papule  R med thigh ant "B" 0.7 cm Brown papule  R med thigh mid "C" 0.6 Brown papule.  R med thigh post "D" 0.5 Brown papule.  R labia major "E" 0.5 Brown papule.    Assessment & Plan  Neoplasm of uncertain behavior of skin (6) Left Axilla "F"  Epidermal / dermal shaving  Lesion diameter (cm):  0.3 Informed consent: discussed and consent obtained   Timeout: patient name, date of birth, surgical site, and procedure verified   Procedure prep:  Patient was prepped and draped in usual sterile fashion Prep type:  Isopropyl alcohol Anesthesia: the lesion was anesthetized in a standard fashion   Anesthetic:  1% lidocaine w/ epinephrine 1-100,000 buffered w/ 8.4% NaHCO3 Instrument used: flexible razor blade   Hemostasis achieved with: pressure, aluminum chloride and electrodesiccation   Outcome: patient tolerated procedure well   Post-procedure details: sterile dressing applied and wound care instructions given   Dressing type: bandage and petrolatum    Anatomic Pathology Report  R thigh distal "A"  Epidermal / dermal shaving  Lesion diameter (cm):  0.4 Informed consent: discussed and consent obtained    Timeout: patient name, date of birth, surgical site, and procedure verified   Procedure prep:  Patient was prepped and draped in usual sterile fashion Prep type:  Isopropyl alcohol Anesthesia: the lesion was anesthetized in a standard fashion   Anesthetic:  1% lidocaine w/ epinephrine 1-100,000 buffered w/ 8.4% NaHCO3 Instrument used: flexible razor blade   Hemostasis achieved with: pressure, aluminum chloride and electrodesiccation   Outcome: patient tolerated procedure well   Post-procedure details: sterile dressing applied and wound care instructions given   Dressing type: bandage and petrolatum    R med thigh ant "B"  Epidermal / dermal shaving  Lesion diameter (cm):  0.7 Informed consent: discussed and consent obtained   Timeout: patient name, date of birth, surgical site, and procedure verified   Procedure prep:  Patient was prepped and draped in usual sterile fashion Prep type:  Isopropyl alcohol Anesthesia: the lesion was anesthetized in a standard fashion   Anesthetic:  1% lidocaine w/ epinephrine 1-100,000 buffered w/ 8.4% NaHCO3 Instrument used: flexible razor blade   Hemostasis achieved with: pressure, aluminum chloride and electrodesiccation   Outcome: patient tolerated procedure well   Post-procedure details: sterile dressing applied and wound care instructions given   Dressing type: bandage and petrolatum    R med thigh mid "C"  Epidermal / dermal shaving  Lesion diameter (cm):  0.6 Informed consent: discussed and consent obtained   Timeout: patient name, date of birth, surgical site, and procedure verified  Procedure prep:  Patient was prepped and draped in usual sterile fashion Prep type:  Isopropyl alcohol Anesthesia: the lesion was anesthetized in a standard fashion   Anesthetic:  1% lidocaine w/ epinephrine 1-100,000 buffered w/ 8.4% NaHCO3 Instrument used: flexible razor blade   Hemostasis achieved with: pressure, aluminum chloride and  electrodesiccation   Outcome: patient tolerated procedure well   Post-procedure details: sterile dressing applied and wound care instructions given   Dressing type: bandage and petrolatum    R med thigh post "D"  Epidermal / dermal shaving  Lesion diameter (cm):  0.5 Informed consent: discussed and consent obtained   Timeout: patient name, date of birth, surgical site, and procedure verified   Procedure prep:  Patient was prepped and draped in usual sterile fashion Prep type:  Isopropyl alcohol Anesthesia: the lesion was anesthetized in a standard fashion   Anesthetic:  1% lidocaine w/ epinephrine 1-100,000 buffered w/ 8.4% NaHCO3 Instrument used: flexible razor blade   Hemostasis achieved with: pressure, aluminum chloride and electrodesiccation   Outcome: patient tolerated procedure well   Post-procedure details: sterile dressing applied and wound care instructions given   Dressing type: bandage and petrolatum    R labia major "E"  Epidermal / dermal shaving  Lesion diameter (cm):  0.5 Informed consent: discussed and consent obtained   Timeout: patient name, date of birth, surgical site, and procedure verified   Procedure prep:  Patient was prepped and draped in usual sterile fashion Prep type:  Isopropyl alcohol Anesthesia: the lesion was anesthetized in a standard fashion   Anesthetic:  1% lidocaine w/ epinephrine 1-100,000 buffered w/ 8.4% NaHCO3 Instrument used: flexible razor blade   Hemostasis achieved with: pressure, aluminum chloride and electrodesiccation   Outcome: patient tolerated procedure well   Post-procedure details: sterile dressing applied and wound care instructions given   Dressing type: bandage and petrolatum    Return for appointment as scheduled - surgery for Spitz nevus.  Luther Redo, CMA, am acting as scribe for Forest Gleason, MD .  Documentation: I have reviewed the above documentation for accuracy and completeness, and I agree with the  above.  Forest Gleason, MD

## 2021-08-11 ENCOUNTER — Encounter: Payer: Managed Care, Other (non HMO) | Admitting: Internal Medicine

## 2021-08-17 ENCOUNTER — Encounter: Payer: Self-pay | Admitting: Dermatology

## 2021-08-18 LAB — ANATOMIC PATHOLOGY REPORT

## 2021-08-21 ENCOUNTER — Telehealth: Payer: Self-pay

## 2021-08-21 NOTE — Telephone Encounter (Signed)
[facility] called wanting to know which specimen you wanted HPV testing done on. Since it was a Friday and no one was here for me to ask I told them to test all of them except the L axilla. Is that what you wanted? The reference # is [id number] and their call back number [phone].

## 2021-08-25 NOTE — Telephone Encounter (Signed)
Yes, that's correct. Thank you.

## 2021-08-26 ENCOUNTER — Other Ambulatory Visit: Payer: Self-pay

## 2021-08-26 ENCOUNTER — Ambulatory Visit: Payer: Managed Care, Other (non HMO) | Admitting: Dermatology

## 2021-08-26 DIAGNOSIS — I781 Nevus, non-neoplastic: Secondary | ICD-10-CM

## 2021-08-26 DIAGNOSIS — D485 Neoplasm of uncertain behavior of skin: Secondary | ICD-10-CM

## 2021-08-26 MED ORDER — MUPIROCIN 2 % EX OINT: 1.0000 "application " | TOPICAL_OINTMENT | Freq: Every day | CUTANEOUS | 0 refills | Status: DC

## 2021-08-26 NOTE — Patient Instructions (Addendum)
[facility]  Wound Care Instructions  Cleanse wound gently with soap and water once a day then pat dry with clean gauze. Apply a thing coat of Petrolatum (petroleum jelly, "Vaseline") over the wound (unless you have an allergy to this). We recommend that you use a new, sterile tube of Vaseline. Do not pick or remove scabs. Do not remove the yellow or white "healing tissue" from the base of the wound.  Cover the wound with fresh, clean, nonstick gauze and secure with paper tape. You may use Band-Aids in place of gauze and tape if the would is small enough, but would recommend trimming much of the tape off as there is often too much. Sometimes Band-Aids can irritate the skin.  You should call the office for your biopsy report after 1 week if you have not already been contacted.  If you experience any problems, such as abnormal amounts of bleeding, swelling, significant bruising, significant pain, or evidence of infection, please call the office immediately.  FOR ADULT SURGERY PATIENTS: If you need something for pain relief you may take 1 extra strength Tylenol (acetaminophen) AND 2 Ibuprofen (200mg  each) together every 4 hours as needed for pain. (do not take these if you are allergic to them or if you have a reason you should not take them.) Typically, you may only need pain medication for 1 to 3 days.   If you have any questions or concerns for your doctor, please call our main line at [phone] and press option 4 to reach your doctor's medical assistant. If no one answers, please leave a voicemail as directed and we will return your call as soon as possible. Messages left after 4 pm will be answered the following business day.   You may also send [place] a message via [place]. We typically respond to MyChart messages within 1-2 business days.  For prescription refills, please ask your pharmacy to contact our office. Our fax number is [phone].  If you have an urgent issue when the clinic is  closed that cannot wait until the next business day, you can page your doctor at the number below.    Please note that while we do our best to be available for urgent issues outside of office hours, we are not available 24/7.   If you have an urgent issue and are unable to reach [place], you may choose to seek medical care at your doctor's office, retail clinic, urgent care center, or emergency room.  If you have a medical emergency, please immediately call 911 or go to the emergency department.  Pager Numbers  - Dr. Nehemiah Massed: [phone]  - Dr. Laurence Ferrari: [phone]  - Dr. Nicole Kindred: [phone]  In the event of inclement weather, please call our main line at [phone] for an update on the status of any delays or closures.  Dermatology Medication Tips: Please keep the boxes that topical medications come in in order to help keep track of the instructions about where and how to use these. Pharmacies typically print the medication instructions only on the boxes and not directly on the medication tubes.   If your medication is too expensive, please contact our office at [phone] option 4 or send [place] a message through [place].   We are unable to tell what your co-pay for medications will be in advance as this is different depending on your insurance coverage. However, we may be able to find a substitute medication at lower cost or fill out paperwork to get insurance to cover  a needed medication.   If a prior authorization is required to get your medication covered by your insurance company, please allow [place] 1-2 business days to complete this process.  Drug prices often vary depending on where the prescription is filled and some pharmacies may offer cheaper prices.  The website www.goodrx.com contains coupons for medications through different pharmacies. The prices here do not account for what the cost may be with help from insurance (it may be cheaper with your insurance), but the website can  give you the price if you did not use any insurance.  - You can print the associated coupon and take it with your prescription to the pharmacy.  - You may also stop by our office during regular business hours and pick up a GoodRx coupon card.  - If you need your prescription sent electronically to a different pharmacy, notify our office through [facility] or by phone at [phone] option 4.

## 2021-08-26 NOTE — Progress Notes (Signed)
   Follow-Up Visit   Subjective  Jenna Winters is a 45 y.o. 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  The following portions of the chart were reviewed this encounter and updated as appropriate:   Tobacco  Allergies  Meds  Problems  Med Hx  Surg Hx  Fam Hx      Review of Systems:  No other skin or systemic complaints except as noted in HPI or Assessment and Plan.  Objective  Well appearing patient in no apparent distress; mood and affect are within normal limits.  A focused examination was performed including right thigh. Relevant physical exam findings are noted in the Assessment and Plan.  Right Posterior Thigh Healing biopsy site   Assessment & Plan  Neoplasm of uncertain behavior of skin Right Posterior Thigh  Skin excision  Lesion length (cm):  0.6 Total excision diameter (cm):  1.6 Informed consent: discussed and consent obtained   Timeout: patient name, date of birth, surgical site, and procedure verified   Procedure prep:  Patient was prepped and draped in usual sterile fashion Prep type:  Chlorhexidine Anesthesia: the lesion was anesthetized in a standard fashion   Anesthetic:  1% lidocaine w/ epinephrine 1-100,000 buffered w/ 8.4% NaHCO3 (6cc lido w/epi, 6cc 0.25% bupivicaine) Instrument used: #15 blade   Hemostasis achieved with: suture, pressure and electrodesiccation   Outcome: patient tolerated procedure well with no complications   Post-procedure details: wound care instructions given   Additional details:  Mupirocin and a pressure dressing applied  Skin repair Complexity:  Intermediate Final length (cm):  5.8 Informed consent: discussed and consent obtained   Timeout: patient name, date of birth, surgical site, and procedure verified   Procedure prep:  Patient was prepped and draped in usual sterile fashion Prep type:  Chlorhexidine Anesthesia: the  lesion was anesthetized in a standard fashion   Anesthetic:  1% lidocaine w/ epinephrine 1-100,000 local infiltration Reason for type of repair: reduce tension to allow closure, reduce the risk of dehiscence, infection, and necrosis, reduce subcutaneous dead space and avoid a hematoma and enhance both functionality and cosmetic results   Undermining: edges undermined and area extensively undermined   Subcutaneous layers (deep stitches):  Suture size:  3-0 Suture type: Vicryl (polyglactin 910)   Stitches:  Buried vertical mattress Fine/surface layer approximation (top stitches):  Suture size:  4-0 Suture type: Prolene (polypropylene)   Suture removal (days):  14 Hemostasis achieved with: suture, pressure and electrodesiccation Outcome: patient tolerated procedure well with no complications   Post-procedure details: wound care instructions given   Additional details:  Mupirocin and a pressure bandage applied   mupirocin ointment (BACTROBAN) 2 % Apply 1 application topically daily. With dressing changes  Related Procedures Anatomic Pathology Report  Return in about 2 weeks (around 09/09/2021) for Suture Removal.  Graciella Belton, RMA, am acting as scribe for Forest Gleason, MD .  Documentation: I have reviewed the above documentation for accuracy and completeness, and I agree with the above.  Forest Gleason, MD

## 2021-08-27 LAB — HPV ISH LOW/HIGH RISK SUBTYPES

## 2021-09-07 ENCOUNTER — Encounter: Payer: Self-pay | Admitting: Dermatology

## 2021-09-08 LAB — ANATOMIC PATHOLOGY REPORT

## 2021-09-09 ENCOUNTER — Other Ambulatory Visit: Payer: Self-pay

## 2021-09-09 ENCOUNTER — Ambulatory Visit (INDEPENDENT_AMBULATORY_CARE_PROVIDER_SITE_OTHER): Payer: Managed Care, Other (non HMO) | Admitting: Dermatology

## 2021-09-09 ENCOUNTER — Encounter: Payer: Self-pay | Admitting: Dermatology

## 2021-09-09 DIAGNOSIS — A630 Anogenital (venereal) warts: Secondary | ICD-10-CM | POA: Diagnosis not present

## 2021-09-09 DIAGNOSIS — Z4802 Encounter for removal of sutures: Secondary | ICD-10-CM

## 2021-09-09 DIAGNOSIS — D485 Neoplasm of uncertain behavior of skin: Secondary | ICD-10-CM

## 2021-09-09 NOTE — Patient Instructions (Addendum)
After Suture Removal  If your medical team has placed Steri-Strips (white adhesive strips covering the surgical site to provide extra support): Keep the area dry until they fall off.  Do not peel them off. Just let them fall off on their own.  If the edges peel up, you can trim them with scissors.   If your team has not placed Steri-Strips: Wash the area daily with soap and water. Then coat the incision site with plain Vaseline and cover with a bandage. Do this daily for 5 days after the sutures are removed. After that, no additional wound care is generally needed.  However, if you would like to help fade the scar, you can apply a silicone scar cream, gel or sheet every night. The scar will remodel for one year after the procedure. If a skin cancer was removed, be sure to keep your appointment with your dermatologist for follow-up and let your dermatology team know if you have any new or changing spots between visits.    Please call our office at [phone] for any questions or concerns.  Recommend Serica moisturizing scar formula cream every night or [facility] brand or Mederma silicone scar sheet every night for the first year after a scar appears to help with scar remodeling if desired. Scars remodel on their own for a full year.   If you have any questions or concerns for your doctor, please call our main line at [phone] and press option 4 to reach your doctor's medical assistant. If no one answers, please leave a voicemail as directed and we will return your call as soon as possible. Messages left after 4 pm will be answered the following business day.   You may also send [place] a message via [place]. We typically respond to MyChart messages within 1-2 business days.  For prescription refills, please ask your pharmacy to contact our office. Our fax number is [phone].  If you have an urgent issue when the clinic is closed that cannot wait until the next business day, you can page  your doctor at the number below.    Please note that while we do our best to be available for urgent issues outside of office hours, we are not available 24/7.   If you have an urgent issue and are unable to reach [place], you may choose to seek medical care at your doctor's office, retail clinic, urgent care center, or emergency room.  If you have a medical emergency, please immediately call 911 or go to the emergency department.  Pager Numbers  - Dr. Nehemiah Massed: [phone]  - Dr. Laurence Ferrari: [phone]  - Dr. Nicole Kindred: [phone]  In the event of inclement weather, please call our main line at [phone] for an update on the status of any delays or closures.  Dermatology Medication Tips: Please keep the boxes that topical medications come in in order to help keep track of the instructions about where and how to use these. Pharmacies typically print the medication instructions only on the boxes and not directly on the medication tubes.   If your medication is too expensive, please contact our office at [phone] option 4 or send [place] a message through [place].   We are unable to tell what your co-pay for medications will be in advance as this is different depending on your insurance coverage. However, we may be able to find a substitute medication at lower cost or fill out paperwork to get insurance to cover a needed medication.   If a  prior authorization is required to get your medication covered by your insurance company, please allow [place] 1-2 business days to complete this process.  Drug prices often vary depending on where the prescription is filled and some pharmacies may offer cheaper prices.  The website www.goodrx.com contains coupons for medications through different pharmacies. The prices here do not account for what the cost may be with help from insurance (it may be cheaper with your insurance), but the website can give you the price if you did not use any insurance.  - You can  print the associated coupon and take it with your prescription to the pharmacy.  - You may also stop by our office during regular business hours and pick up a GoodRx coupon card.  - If you need your prescription sent electronically to a different pharmacy, notify our office through [facility] or by phone at [phone] option 4.

## 2021-09-09 NOTE — Progress Notes (Signed)
   Follow-Up Visit   Subjective  Jenna Winters is a 45 y.o. female who presents for the following: Follow-up (Patient here today for suture removal at right posterior thigh. ).  Also has a new bump left labia majora.  The following portions of the chart were reviewed this encounter and updated as appropriate:  Tobacco  Allergies  Meds  Problems  Med Hx  Surg Hx  Fam Hx      Review of Systems: No other skin or systemic complaints except as noted in HPI or Assessment and Plan.   Objective  Well appearing patient in no apparent distress; mood and affect are within normal limits.  A focused examination was performed including right posterior thigh, groin. Relevant physical exam findings are noted in the Assessment and Plan.  left labia majora x 1 Light brown papule   Assessment & Plan  Encounter for Removal of Sutures - Incision site at the right posterior thigh is clean, dry and intact - Wound cleansed, sutures removed, wound cleansed and steri strips applied.  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 - Scars remodel for a full year. - Once steri-strips fall off, patient can apply over-the-counter silicone scar cream each night to help with scar remodeling if desired. - Patient advised to call with any concerns or if they notice any new or changing lesions.  Condyloma left labia majora x 1  Discussed viral etiology and risk of spread.  Discussed multiple treatments may be required to clear warts.  Discussed possible post-treatment dyspigmentation and risk of recurrence.  Discussed imiquimod cream - deferred today but may consider if she develops others  Prior to procedure, discussed risks of blister formation, small wound, skin dyspigmentation, or rare scar following cryotherapy. Recommend Vaseline ointment to treated areas while  healing.   Destruction of lesion - left labia majora x 1  Destruction method: cryotherapy   Informed consent: discussed and consent obtained   Lesion destroyed using liquid nitrogen: Yes   Cryotherapy cycles:  2 Outcome: patient tolerated procedure well with no complications   Post-procedure details: wound care instructions given    Return for FBSE in Feb. I, Ruthell Rummage, CMA, am acting as scribe for Forest Gleason, MD.  Documentation: I have reviewed the above documentation for accuracy and completeness, and I agree with the above.  Forest Gleason, MD

## 2021-09-23 ENCOUNTER — Encounter: Payer: Managed Care, Other (non HMO) | Admitting: Internal Medicine

## 2021-10-01 ENCOUNTER — Encounter: Payer: Managed Care, Other (non HMO) | Admitting: Internal Medicine

## 2021-10-14 ENCOUNTER — Telehealth: Payer: Self-pay | Admitting: Internal Medicine

## 2021-10-14 NOTE — Telephone Encounter (Signed)
Pt called in stating she thinks that she may have a bladder infection or UTI. Pt stated that this morning she was using the bathroom quite frequently. Pt stated that she notice blood in her urine when she pee. Transfer Pt to access nurse

## 2021-10-19 NOTE — Telephone Encounter (Signed)
LM to follow up from access nurse

## 2021-10-20 NOTE — Telephone Encounter (Signed)
Patient was seen at urgent care. Notes in care everywhere. Culture was sensitive to Macrobid. Patient says symptoms have resolved and I have scheduled her for a f/u with Dr Nicki Reaper to do her cpe in Jan

## 2021-11-08 ENCOUNTER — Encounter: Payer: Self-pay | Admitting: Internal Medicine

## 2021-11-09 MED ORDER — HYDROCHLOROTHIAZIDE 12.5 MG PO CAPS: ORAL_CAPSULE | ORAL | 1 refills | Status: DC

## 2021-11-09 MED ORDER — SERTRALINE HCL 50 MG PO TABS
ORAL_TABLET | ORAL | 1 refills | Status: DC
Start: 2021-11-09 — End: 2022-04-12

## 2021-11-26 ENCOUNTER — Encounter: Payer: Self-pay | Admitting: Internal Medicine

## 2021-11-26 ENCOUNTER — Other Ambulatory Visit: Payer: Self-pay

## 2021-11-26 ENCOUNTER — Ambulatory Visit (INDEPENDENT_AMBULATORY_CARE_PROVIDER_SITE_OTHER): Payer: Managed Care, Other (non HMO) | Admitting: Internal Medicine

## 2021-11-26 ENCOUNTER — Other Ambulatory Visit (HOSPITAL_COMMUNITY)
Admission: RE | Admit: 2021-11-26 | Discharge: 2021-11-26 | Disposition: A | Discharge status: Home / Self Care | Payer: Managed Care, Other (non HMO) | Source: Ambulatory Visit | Attending: Internal Medicine | Admitting: Internal Medicine

## 2021-11-26 VITALS — BP 126/84 | HR 63 | Ht 67.01 in | Wt 217.2 lb

## 2021-11-26 DIAGNOSIS — R739 Hyperglycemia, unspecified: Secondary | ICD-10-CM

## 2021-11-26 DIAGNOSIS — K760 Fatty (change of) liver, not elsewhere classified: Secondary | ICD-10-CM

## 2021-11-26 DIAGNOSIS — I10 Essential (primary) hypertension: Secondary | ICD-10-CM

## 2021-11-26 DIAGNOSIS — E039 Hypothyroidism, unspecified: Secondary | ICD-10-CM

## 2021-11-26 DIAGNOSIS — Z23 Encounter for immunization: Secondary | ICD-10-CM

## 2021-11-26 DIAGNOSIS — Z124 Encounter for screening for malignant neoplasm of cervix: Secondary | ICD-10-CM | POA: Insufficient documentation

## 2021-11-26 DIAGNOSIS — E7800 Pure hypercholesterolemia, unspecified: Secondary | ICD-10-CM

## 2021-11-26 DIAGNOSIS — Z9884 Bariatric surgery status: Secondary | ICD-10-CM

## 2021-11-26 DIAGNOSIS — Z0000 Encounter for general adult medical examination without abnormal findings: Secondary | ICD-10-CM

## 2021-11-26 DIAGNOSIS — F439 Reaction to severe stress, unspecified: Secondary | ICD-10-CM

## 2021-11-26 NOTE — Progress Notes (Signed)
Patient ID: Jenna Winters, female   DOB: March 11, 1976, 46 y.o.   MRN: [id number]   Subjective:    Patient ID: Jenna Winters, female    DOB: 1976-07-08, 46 y.o.   MRN: [id number]  This visit occurred during the SARS-CoV-2 public health emergency.  Safety protocols were in place, including screening questions prior to the visit, additional usage of staff PPE, and extensive cleaning of exam room while observing appropriate contact time as indicated for disinfecting solutions.   Patient here for her physical exam.   Chief Complaint  Patient presents with   Annual Exam   .   HPI She reports she is doing relatively well.  Working.  Appears to be handling stress.  Taking zoloft.  Still watching her diet.  Discussed exercise.  No chest pain or sob with increased exertion reported.  No increased cough or congestion.  Fiber - keeping bowels moving.  Saw dermatology.  Biopsy skin lesion and vaginal lesion.  Continues f/u with dermatology.  Using a stand up desk now.  No upper GI symptoms reported.  If watches what she eats (avoiding spicy foods) - ok.  Rarely has to take TUMS.    Past Medical History:  Diagnosis Date   Depression    Hemorrhoid    History of basal cell carcinoma (BCC) 01/29/2019   left nasal supratip, Moh's at [facility]   Hypertension    Hyperthyroidism 11/23/2007   s/p ablation with resulting hypothyroidism   Spitz nevus 06/25/2021   right posterior thigh   Past Surgical History:  Procedure Laterality Date   ABDOMINAL HYSTERECTOMY  08/2011   BREAST BIOPSY Left 2015   core bx fibroadenoma   COLONOSCOPY     DILATION AND CURETTAGE OF UTERUS  12/2010   DILATION AND CURETTAGE OF UTERUS  1995   HEMORRHOID SURGERY  08/2013   Family History  Problem Relation Age of Onset   Hyperlipidemia Father    Hypertension Father    Diabetes Father    Hyperlipidemia Paternal Grandmother    Hypertension Paternal Grandmother    Diabetes Paternal Grandmother    Hyperlipidemia Paternal  Grandfather    Hypertension Paternal Grandfather    Diabetes Paternal Grandfather    Lung cancer Paternal Grandfather    COPD Paternal Grandfather        lung   Breast cancer Neg Hx    Social History   Socioeconomic History   Marital status: Married    Spouse name: Not on file   Number of children: 1   Years of education: Not on file   Highest education level: Not on file  Occupational History   Occupation: Buyer, retail: [employer]  Tobacco Use   Smoking status: Never   Smokeless tobacco: Never  Substance and Sexual Activity   Alcohol use: Yes    Alcohol/week: 0.0 standard drinks    Comment: rare occasion   Drug use: No   Sexual activity: Not on file  Other Topics Concern   Not on file  Social History Narrative   Not on file   Social Determinants of Health   Financial Resource Strain: Not on file  Food Insecurity: Not on file  Transportation Needs: Not on file  Physical Activity: Not on file  Stress: Not on file  Social Connections: Not on file     Review of Systems  Constitutional:  Negative for appetite change and unexpected weight change.  HENT:  Negative for congestion, sinus pressure and sore throat.  Eyes:  Negative for pain and visual disturbance.  Respiratory:  Negative for cough, chest tightness and shortness of breath.   Cardiovascular:  Negative for chest pain, palpitations and leg swelling.  Gastrointestinal:  Negative for abdominal pain, diarrhea, nausea and vomiting.  Genitourinary:  Negative for difficulty urinating and dysuria.  Musculoskeletal:  Negative for joint swelling and myalgias.  Skin:  Negative for color change and rash.  Neurological:  Negative for dizziness, light-headedness and headaches.  Hematological:  Negative for adenopathy. Does not bruise/bleed easily.  Psychiatric/Behavioral:  Negative for agitation and dysphoric mood.       Objective:     BP 126/84    Pulse 63    Ht 5' 7.01" (1.702 m)    Wt 217 lb 3.2 oz  (98.5 kg)    SpO2 97%    BMI 34.01 kg/m  Wt Readings from Last 3 Encounters:  11/26/21 217 lb 3.2 oz (98.5 kg)  03/23/21 208 lb (94.3 kg)  12/04/19 237 lb 3.2 oz (107.6 kg)    Physical Exam Vitals reviewed.  Constitutional:      General: She is not in acute distress.    Appearance: Normal appearance. She is well-developed.  HENT:     Head: Normocephalic and atraumatic.     Right Ear: External ear normal.     Left Ear: External ear normal.  Eyes:     General: No scleral icterus.       Right eye: No discharge.        Left eye: No discharge.     Conjunctiva/sclera: Conjunctivae normal.  Neck:     Thyroid: No thyromegaly.  Cardiovascular:     Rate and Rhythm: Normal rate and regular rhythm.  Pulmonary:     Effort: No tachypnea, accessory muscle usage or respiratory distress.     Breath sounds: Normal breath sounds. No decreased breath sounds or wheezing.  Chest:  Breasts:    Right: No inverted nipple, mass, nipple discharge or tenderness (no axillary adenopathy).     Left: No inverted nipple, mass, nipple discharge or tenderness (no axilarry adenopathy).  Abdominal:     General: Bowel sounds are normal.     Palpations: Abdomen is soft.     Tenderness: There is no abdominal tenderness.  Genitourinary:    Comments: Normal external genitalia.  Vaginal vault without lesions.  S/p hysterectomy.  Pap smear performed of vaginal cuff.  Could not appreciate any adnexal masses or tenderness.   Musculoskeletal:        General: No swelling or tenderness.     Cervical back: Neck supple.  Lymphadenopathy:     Cervical: No cervical adenopathy.  Skin:    Findings: No erythema or rash.  Neurological:     Mental Status: She is alert and oriented to person, place, and time.  Psychiatric:        Mood and Affect: Mood normal.        Behavior: Behavior normal.     Outpatient Encounter Medications as of 11/26/2021  Medication Sig   Calcipotriene-Betameth Diprop (ENSTILAR) 0.005-0.064 %  FOAM Apply to scalp daily as needed for psoriasis. Avoid applying to face, groin, and axilla. Use as directed. Risk of skin atrophy with long-term use reviewed.   cholecalciferol (VITAMIN D) 1000 UNITS tablet Take 2,000 Units by mouth daily.   hydrochlorothiazide (MICROZIDE) 12.5 MG capsule TAKE 1 CAPSULE(12.5 MG) BY MOUTH DAILY   levothyroxine (SYNTHROID) 150 MCG tablet Take 1 tablet (150 mcg total) by mouth daily before breakfast.  metoprolol succinate (TOPROL-XL) 50 MG 24 hr tablet Take 1 tablet (50 mg total) by mouth daily. TAKE 1 TABLET BY MOUTH DAILY, WITH OR IMMEDIATELY FOLLOWING MEALS   mupirocin ointment (BACTROBAN) 2 % Apply 1 application topically daily. With dressing changes   sertraline (ZOLOFT) 50 MG tablet TAKE 1 TABLET(50 MG) BY MOUTH DAILY   triamcinolone cream (KENALOG) 0.1 % Apply 1 application topically 2 (two) times daily.   No facility-administered encounter medications on file as of 11/26/2021.     Lab Results  Component Value Date   WBC 4.4 05/27/2021   HGB 13.7 05/27/2021   HCT 41.8 05/27/2021   PLT 170 05/27/2021   GLUCOSE 84 05/27/2021   CHOL 173 05/27/2021   TRIG 110 05/27/2021   HDL 49 05/27/2021   LDLCALC 104 (H) 05/27/2021   ALT 38 (H) 01/16/2019   AST 27 01/16/2019   NA 142 05/27/2021   K 4.0 05/27/2021   CL 102 05/27/2021   CREATININE 0.72 05/27/2021   BUN 18 05/27/2021   CO2 27 05/27/2021   TSH 1.400 05/27/2021   HGBA1C 4.9 05/27/2021    MM 3D SCREEN BREAST BILATERAL  Result Date: 04/07/2021 CLINICAL DATA:  Screening. EXAM: DIGITAL SCREENING BILATERAL MAMMOGRAM WITH TOMOSYNTHESIS AND CAD TECHNIQUE: Bilateral screening digital craniocaudal and mediolateral oblique mammograms were obtained. Bilateral screening digital breast tomosynthesis was performed. The images were evaluated with computer-aided detection. COMPARISON:  Previous exam(s). ACR Breast Density Category b: There are scattered areas of fibroglandular density. FINDINGS: There are no  findings suspicious for malignancy. The images were evaluated with computer-aided detection. IMPRESSION: No mammographic evidence of malignancy. A result letter of this screening mammogram will be mailed directly to the patient. RECOMMENDATION: Screening mammogram in one year. (Code:SM-B-01Y) BI-RADS CATEGORY  1: Negative. Electronically Signed   By: Ammie Ferrier M.D.   On: 04/07/2021 15:38       Assessment & Plan:   Problem List Items Addressed This Visit     Essential hypertension, benign    Continue metoprolol.  Blood pressure doing well.  Follow pressures.  Follow metabolic panel.       Fatty infiltration of liver    S/p bariatric surgery.  Has lost weight.  Follow liver panel.       Health care maintenance    Physical today 11/26/21.  PAP 11/26/21.  Mammogram 04/07/21 - Birads I.  States had colonoscopy [facility]).  Obtain results.  Flu vaccine today.       History of gastric bypass    S/p bariatric surgery.  Has done well. Lost weight.  Feels better.  Swallowing ok.  Check cbc and iron studies.       Hypercholesterolemia    Has adjusted her diet.  Lost weight.  Check lipid panel.       Hyperglycemia    Has adjusted her diet.  Lost weight.  Check met b and a1c.       Hypothyroidism (acquired)    On thyroid replacement.  Follow tsh.        Stress    Continue zoloft.  Overall appears to be handling things relatively well.  Follow.       Other Visit Diagnoses     Need for immunization against influenza    -  Primary   Relevant Orders   Flu Vaccine QUAD 11moIM (Fluarix, Fluzone & Alfiuria Quad PF) (Completed)   Cervical cancer screening       Relevant Orders   Cytology - PAP( [facility])        Einar Pheasant, MD

## 2021-11-26 NOTE — Assessment & Plan Note (Signed)
Physical today 11/26/21.  PAP 11/26/21.  Mammogram 04/07/21 - Birads I.  States had colonoscopy [facility]).  Obtain results.  Flu vaccine today.

## 2021-11-30 ENCOUNTER — Encounter: Payer: Self-pay | Admitting: Internal Medicine

## 2021-11-30 LAB — CYTOLOGY - PAP
Comment: NEGATIVE
Diagnosis: NEGATIVE
High risk HPV: NEGATIVE

## 2021-11-30 NOTE — Assessment & Plan Note (Signed)
On thyroid replacement.  Follow tsh.  

## 2021-11-30 NOTE — Assessment & Plan Note (Signed)
Has adjusted her diet.  Lost weight.  Check met b and a1c.  

## 2021-11-30 NOTE — Assessment & Plan Note (Signed)
Continue metoprolol.  Blood pressure doing well.  Follow pressures.  Follow metabolic panel.  

## 2021-11-30 NOTE — Assessment & Plan Note (Signed)
S/p bariatric surgery.  Has done well. Lost weight.  Feels better.  Swallowing ok.  Check cbc and iron studies.

## 2021-11-30 NOTE — Assessment & Plan Note (Signed)
Has adjusted her diet.  Lost weight.  Check lipid panel.  

## 2021-11-30 NOTE — Assessment & Plan Note (Signed)
Continue zoloft.  Overall appears to be handling things relatively well.  Follow.  

## 2021-11-30 NOTE — Assessment & Plan Note (Signed)
S/p bariatric surgery.  Has lost weight.  Follow liver panel.

## 2021-12-31 ENCOUNTER — Ambulatory Visit: Payer: Managed Care, Other (non HMO) | Admitting: Dermatology

## 2022-01-08 ENCOUNTER — Other Ambulatory Visit: Payer: Self-pay

## 2022-01-08 ENCOUNTER — Telehealth: Payer: Self-pay

## 2022-01-08 ENCOUNTER — Encounter: Payer: Self-pay | Admitting: Internal Medicine

## 2022-01-08 MED ORDER — METOPROLOL SUCCINATE ER 50 MG PO TB24: 50.0000 mg | ORAL_TABLET | Freq: Every day | ORAL | 0 refills | Status: DC

## 2022-01-08 MED ORDER — LEVOTHYROXINE SODIUM 150 MCG PO TABS: 150.0000 ug | ORAL_TABLET | Freq: Every day | ORAL | 1 refills | Status: DC

## 2022-01-08 NOTE — Telephone Encounter (Signed)
Sent pt mychart msg to advise meds refilled

## 2022-01-08 NOTE — Telephone Encounter (Signed)
Medication refilled

## 2022-04-10 ENCOUNTER — Other Ambulatory Visit: Payer: Self-pay | Admitting: Internal Medicine

## 2022-04-11 ENCOUNTER — Encounter: Payer: Self-pay | Admitting: Internal Medicine

## 2022-04-12 ENCOUNTER — Other Ambulatory Visit: Payer: Self-pay

## 2022-04-12 MED ORDER — SERTRALINE HCL 50 MG PO TABS: ORAL_TABLET | ORAL | 1 refills | Status: DC

## 2022-04-12 MED ORDER — HYDROCHLOROTHIAZIDE 12.5 MG PO CAPS: ORAL_CAPSULE | ORAL | 1 refills | Status: DC

## 2022-04-12 MED ORDER — METOPROLOL SUCCINATE ER 50 MG PO TB24: 50.0000 mg | ORAL_TABLET | Freq: Every day | ORAL | 0 refills | Status: DC

## 2022-05-28 ENCOUNTER — Encounter: Payer: Self-pay | Admitting: Internal Medicine

## 2022-05-28 ENCOUNTER — Ambulatory Visit: Payer: Managed Care, Other (non HMO) | Admitting: Internal Medicine

## 2022-05-28 VITALS — BP 130/82 | HR 78 | Temp 98.6°F | Resp 19 | Ht 67.0 in | Wt 218.4 lb

## 2022-05-28 DIAGNOSIS — E7800 Pure hypercholesterolemia, unspecified: Secondary | ICD-10-CM

## 2022-05-28 DIAGNOSIS — R739 Hyperglycemia, unspecified: Secondary | ICD-10-CM | POA: Diagnosis not present

## 2022-05-28 DIAGNOSIS — Z1231 Encounter for screening mammogram for malignant neoplasm of breast: Secondary | ICD-10-CM

## 2022-05-28 DIAGNOSIS — E039 Hypothyroidism, unspecified: Secondary | ICD-10-CM | POA: Diagnosis not present

## 2022-05-28 DIAGNOSIS — R7989 Other specified abnormal findings of blood chemistry: Secondary | ICD-10-CM

## 2022-05-28 DIAGNOSIS — F439 Reaction to severe stress, unspecified: Secondary | ICD-10-CM

## 2022-05-28 DIAGNOSIS — I10 Essential (primary) hypertension: Secondary | ICD-10-CM | POA: Diagnosis not present

## 2022-05-28 DIAGNOSIS — K760 Fatty (change of) liver, not elsewhere classified: Secondary | ICD-10-CM

## 2022-05-28 NOTE — Progress Notes (Unsigned)
Patient ID: Jenna Winters, female   DOB: 1976/11/20, 46 y.o.   MRN: [id number]   Subjective:    Patient ID: Jenna Winters, female    DOB: [birth year], 46 y.o.   MRN: [id number]  This visit occurred during the SARS-CoV-2 public health emergency.  Safety protocols were in place, including screening questions prior to the visit, additional usage of staff PPE, and extensive cleaning of exam room while observing appropriate contact time as indicated for disinfecting solutions.   Patient here for  Chief Complaint  Patient presents with   Hypertension   .   HPI    Past Medical History:  Diagnosis Date   Depression    Hemorrhoid    History of basal cell carcinoma (BCC) 01/29/2019   left nasal supratip, Moh's at [facility]   Hypertension    Hyperthyroidism 11/23/2007   s/p ablation with resulting hypothyroidism   Spitz nevus 06/25/2021   right posterior thigh   Past Surgical History:  Procedure Laterality Date   ABDOMINAL HYSTERECTOMY  08/2011   BREAST BIOPSY Left 2015   core bx fibroadenoma   COLONOSCOPY     DILATION AND CURETTAGE OF UTERUS  12/2010   DILATION AND CURETTAGE OF UTERUS  1995   HEMORRHOID SURGERY  08/2013   Family History  Problem Relation Age of Onset   Hyperlipidemia Father    Hypertension Father    Diabetes Father    Hyperlipidemia Paternal Grandmother    Hypertension Paternal Grandmother    Diabetes Paternal Grandmother    Hyperlipidemia Paternal Grandfather    Hypertension Paternal Grandfather    Diabetes Paternal Grandfather    Lung cancer Paternal Grandfather    COPD Paternal Grandfather        lung   Breast cancer Neg Hx    Social History   Socioeconomic History   Marital status: Married    Spouse name: Not on file   Number of children: 1   Years of education: Not on file   Highest education level: Not on file  Occupational History   Occupation: Buyer, retail: [employer]  Tobacco Use   Smoking status: Never   Smokeless tobacco: Never   Substance and Sexual Activity   Alcohol use: Yes    Alcohol/week: 0.0 standard drinks of alcohol    Comment: rare occasion   Drug use: No   Sexual activity: Not on file  Other Topics Concern   Not on file  Social History Narrative   Not on file   Social Determinants of Health   Financial Resource Strain: Not on file  Food Insecurity: Not on file  Transportation Needs: Not on file  Physical Activity: Not on file  Stress: Not on file  Social Connections: Not on file     Review of Systems     Objective:     BP 130/82 (BP Location: Left Arm, Patient Position: Sitting, Cuff Size: Large)   Pulse 78   Temp 98.6 F (37 C) (Temporal)   Resp 19   Ht '5\' 7"'$  (1.702 m)   Wt 218 lb 6.4 oz (99.1 kg)   SpO2 98%   BMI 34.21 kg/m  Wt Readings from Last 3 Encounters:  05/28/22 218 lb 6.4 oz (99.1 kg)  11/26/21 217 lb 3.2 oz (98.5 kg)  03/23/21 208 lb (94.3 kg)    Physical Exam   Outpatient Encounter Medications as of 05/28/2022  Medication Sig   Calcipotriene-Betameth Diprop (ENSTILAR) 0.005-0.064 % FOAM Apply to scalp daily  as needed for psoriasis. Avoid applying to face, groin, and axilla. Use as directed. Risk of skin atrophy with long-term use reviewed.   cholecalciferol (VITAMIN D) 1000 UNITS tablet Take 2,000 Units by mouth daily.   hydrochlorothiazide (MICROZIDE) 12.5 MG capsule TAKE 1 CAPSULE(12.5 MG) BY MOUTH DAILY   levothyroxine (SYNTHROID) 150 MCG tablet Take 1 tablet (150 mcg total) by mouth daily before breakfast.   metoprolol succinate (TOPROL-XL) 50 MG 24 hr tablet Take 1 tablet (50 mg total) by mouth daily. TAKE 1 TABLET BY MOUTH DAILY, WITH OR IMMEDIATELY FOLLOWING MEALS   sertraline (ZOLOFT) 50 MG tablet TAKE 1 TABLET(50 MG) BY MOUTH DAILY   triamcinolone cream (KENALOG) 0.1 % Apply 1 application topically 2 (two) times daily.   mupirocin ointment (BACTROBAN) 2 % Apply 1 application topically daily. With dressing changes (Patient not taking: Reported on  05/28/2022)   No facility-administered encounter medications on file as of 05/28/2022.     Lab Results  Component Value Date   WBC 4.4 05/27/2021   HGB 13.7 05/27/2021   HCT 41.8 05/27/2021   PLT 170 05/27/2021   GLUCOSE 84 05/27/2021   CHOL 173 05/27/2021   TRIG 110 05/27/2021   HDL 49 05/27/2021   LDLCALC 104 (H) 05/27/2021   ALT 38 (H) 01/16/2019   AST 27 01/16/2019   NA 142 05/27/2021   K 4.0 05/27/2021   CL 102 05/27/2021   CREATININE 0.72 05/27/2021   BUN 18 05/27/2021   CO2 27 05/27/2021   TSH 1.400 05/27/2021   HGBA1C 4.9 05/27/2021       Assessment & Plan:   Problem List Items Addressed This Visit     Essential hypertension, benign - Primary   Hypercholesterolemia   Hyperglycemia   Hypothyroidism (acquired)   Other Visit Diagnoses     Elevated ferritin level       Encounter for screening mammogram for malignant neoplasm of breast            Einar Pheasant, MD

## 2022-05-29 ENCOUNTER — Encounter: Payer: Self-pay | Admitting: Internal Medicine

## 2022-05-29 ENCOUNTER — Telehealth: Payer: Self-pay | Admitting: Internal Medicine

## 2022-05-29 NOTE — Assessment & Plan Note (Signed)
S/p bariatric surgery.  Has lost weight. Continue diet and exercise.  Follow liver panel.

## 2022-05-29 NOTE — Telephone Encounter (Signed)
Mammogram has been ordered.  Need to schedule.

## 2022-05-29 NOTE — Assessment & Plan Note (Signed)
Continue zoloft.  Some better since working from home.  Follow.

## 2022-05-29 NOTE — Assessment & Plan Note (Signed)
Has adjusted her diet.  Lost weight.  Check lipid panel.

## 2022-05-29 NOTE — Assessment & Plan Note (Signed)
Has adjusted her diet.  Lost weight.  Check met b and a1c.  

## 2022-05-29 NOTE — Assessment & Plan Note (Signed)
Continue metoprolol.  Blood pressure doing well.  Follow pressures.  Follow metabolic panel.

## 2022-05-29 NOTE — Assessment & Plan Note (Signed)
On thyroid replacement.  Follow tsh.  

## 2022-06-03 NOTE — Telephone Encounter (Signed)
Pt scheduled for 8/10 at 820am Pt aware

## 2022-07-01 ENCOUNTER — Ambulatory Visit
Admission: RE | Admit: 2022-07-01 | Discharge: 2022-07-01 | Disposition: A | Discharge status: Home / Self Care | Payer: Managed Care, Other (non HMO) | Source: Ambulatory Visit | Attending: Internal Medicine | Admitting: Internal Medicine

## 2022-07-01 DIAGNOSIS — Z1231 Encounter for screening mammogram for malignant neoplasm of breast: Secondary | ICD-10-CM | POA: Insufficient documentation

## 2022-07-21 ENCOUNTER — Other Ambulatory Visit: Payer: Self-pay | Admitting: Internal Medicine

## 2022-07-22 LAB — CBC WITH DIFFERENTIAL/PLATELET
Basophils Absolute: 0 10*3/uL (ref 0.0–0.2)
Basos: 1 %
EOS (ABSOLUTE): 0.1 10*3/uL (ref 0.0–0.4)
Eos: 2 %
Hematocrit: 42 % (ref 34.0–46.6)
Hemoglobin: 14 g/dL (ref 11.1–15.9)
Immature Grans (Abs): 0 10*3/uL (ref 0.0–0.1)
Immature Granulocytes: 0 %
Lymphocytes Absolute: 1.7 10*3/uL (ref 0.7–3.1)
Lymphs: 41 %
MCH: 29.8 pg (ref 26.6–33.0)
MCHC: 33.3 g/dL (ref 31.5–35.7)
MCV: 89 fL (ref 79–97)
Monocytes Absolute: 0.3 10*3/uL (ref 0.1–0.9)
Monocytes: 6 %
Neutrophils Absolute: 2.1 10*3/uL (ref 1.4–7.0)
Neutrophils: 50 %
Platelets: 191 10*3/uL (ref 150–450)
RBC: 4.7 x10E6/uL (ref 3.77–5.28)
RDW: 12.1 % (ref 11.7–15.4)
WBC: 4.2 10*3/uL (ref 3.4–10.8)

## 2022-07-22 LAB — IRON,TIBC AND FERRITIN PANEL
Ferritin: 125 ng/mL (ref 15–150)
Iron Saturation: 17 % (ref 15–55)
Iron: 60 ug/dL (ref 27–159)
Total Iron Binding Capacity: 348 ug/dL (ref 250–450)
UIBC: 288 ug/dL (ref 131–425)

## 2022-07-22 LAB — HEMOGLOBIN A1C
Est. average glucose Bld gHb Est-mCnc: 103 mg/dL
Hgb A1c MFr Bld: 5.2 % (ref 4.8–5.6)

## 2022-07-22 LAB — HEPATIC FUNCTION PANEL
ALT: 19 IU/L (ref 0–32)
AST: 19 IU/L (ref 0–40)
Albumin: 4.6 g/dL (ref 3.9–4.9)
Alkaline Phosphatase: 86 IU/L (ref 44–121)
Bilirubin Total: 0.6 mg/dL (ref 0.0–1.2)
Bilirubin, Direct: 0.16 mg/dL (ref 0.00–0.40)
Total Protein: 7.1 g/dL (ref 6.0–8.5)

## 2022-07-22 LAB — BASIC METABOLIC PANEL
BUN/Creatinine Ratio: 25 — ABNORMAL HIGH (ref 9–23)
BUN: 18 mg/dL (ref 6–24)
CO2: 29 mmol/L (ref 20–29)
Calcium: 9.2 mg/dL (ref 8.7–10.2)
Chloride: 100 mmol/L (ref 96–106)
Creatinine, Ser: 0.73 mg/dL (ref 0.57–1.00)
Glucose: 98 mg/dL (ref 70–99)
Potassium: 4.5 mmol/L (ref 3.5–5.2)
Sodium: 141 mmol/L (ref 134–144)
eGFR: 103 mL/min/{1.73_m2} (ref >59)

## 2022-07-22 LAB — LIPID PANEL
Chol/HDL Ratio: 3.5 ratio (ref 0.0–4.4)
Cholesterol, Total: 204 mg/dL — ABNORMAL HIGH (ref 100–199)
HDL: 58 mg/dL (ref >39)
LDL Chol Calc (NIH): 134 mg/dL — ABNORMAL HIGH (ref 0–99)
Triglycerides: 68 mg/dL (ref 0–149)
VLDL Cholesterol Cal: 12 mg/dL (ref 5–40)

## 2022-07-22 LAB — TSH: TSH: 0.563 u[IU]/mL (ref 0.450–4.500)

## 2022-07-25 ENCOUNTER — Other Ambulatory Visit: Payer: Self-pay | Admitting: Internal Medicine

## 2022-10-07 ENCOUNTER — Other Ambulatory Visit: Payer: Self-pay | Admitting: Family

## 2022-10-21 ENCOUNTER — Encounter: Payer: Self-pay | Admitting: Internal Medicine

## 2022-10-21 DIAGNOSIS — I10 Essential (primary) hypertension: Secondary | ICD-10-CM

## 2022-10-22 MED ORDER — METOPROLOL SUCCINATE ER 50 MG PO TB24: ORAL_TABLET | ORAL | 0 refills | Status: DC

## 2022-10-22 MED ORDER — SERTRALINE HCL 50 MG PO TABS: ORAL_TABLET | ORAL | 1 refills | Status: DC

## 2022-10-22 MED ORDER — HYDROCHLOROTHIAZIDE 12.5 MG PO CAPS: ORAL_CAPSULE | ORAL | 1 refills | Status: DC

## 2022-10-22 NOTE — Telephone Encounter (Signed)
Refilled zoloft - #90 with one refill.

## 2022-11-30 ENCOUNTER — Encounter: Payer: Self-pay | Admitting: Internal Medicine

## 2022-11-30 ENCOUNTER — Ambulatory Visit (INDEPENDENT_AMBULATORY_CARE_PROVIDER_SITE_OTHER): Payer: Managed Care, Other (non HMO) | Admitting: Internal Medicine

## 2022-11-30 DIAGNOSIS — R739 Hyperglycemia, unspecified: Secondary | ICD-10-CM

## 2022-11-30 DIAGNOSIS — I10 Essential (primary) hypertension: Secondary | ICD-10-CM

## 2022-11-30 DIAGNOSIS — E7800 Pure hypercholesterolemia, unspecified: Secondary | ICD-10-CM

## 2022-11-30 DIAGNOSIS — E039 Hypothyroidism, unspecified: Secondary | ICD-10-CM

## 2022-11-30 DIAGNOSIS — Z0000 Encounter for general adult medical examination without abnormal findings: Secondary | ICD-10-CM

## 2022-11-30 NOTE — Assessment & Plan Note (Signed)
Physical today 11/30/22.  PAP 11/26/21 negative with negative HPV.  S/p hysterectomy.  Mammogram 07/01/22 - Birads I.  States had colonoscopy [facility]).  Obtain results.

## 2022-11-30 NOTE — Progress Notes (Deleted)
Subjective:    Patient ID: Jenna Winters, female    DOB: 07-13-1976, 47 y.o.   MRN: [id number]  Patient here for No chief complaint on file.   HPI Here for physical.  Working from home.   Mouthpiece when sleeps.   On zoloft.    Past Medical History:  Diagnosis Date   Depression    Hemorrhoid    History of basal cell carcinoma (BCC) 01/29/2019   left nasal supratip, Moh's at [facility]   Hypertension    Hyperthyroidism 11/23/2007   s/p ablation with resulting hypothyroidism   Spitz nevus 06/25/2021   right posterior thigh   Past Surgical History:  Procedure Laterality Date   ABDOMINAL HYSTERECTOMY  08/2011   BREAST BIOPSY Left 2015   core bx fibroadenoma   COLONOSCOPY     DILATION AND CURETTAGE OF UTERUS  12/2010   DILATION AND CURETTAGE OF UTERUS  1995   HEMORRHOID SURGERY  08/2013   Family History  Problem Relation Age of Onset   Hyperlipidemia Father    Hypertension Father    Diabetes Father    Hyperlipidemia Paternal Grandmother    Hypertension Paternal Grandmother    Diabetes Paternal Grandmother    Hyperlipidemia Paternal Grandfather    Hypertension Paternal Grandfather    Diabetes Paternal Grandfather    Lung cancer Paternal Grandfather    COPD Paternal Grandfather        lung   Breast cancer Neg Hx    Social History   Socioeconomic History   Marital status: Married    Spouse name: Not on file   Number of children: 1   Years of education: Not on file   Highest education level: Not on file  Occupational History   Occupation: Buyer, 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[employer]  Tobacco Use   Smoking status: Never   Smokeless tobacco: Never  Substance and Sexual Activity   Alcohol use: Yes    Alcohol/week: 0.0 standard drinks of alcohol    Comment: rare occasion   Drug use: No   Sexual activity: Not on file  Other Topics Concern   Not on file  Social History Narrative   Not on file   Social Determinants of Health   Financial Resource Strain: Not on file   Food Insecurity: Not on file  Transportation Needs: Not on file  Physical Activity: Not on file  Stress: Not on file  Social Connections: Not on file     Review of Systems     Objective:     There were no vitals taken for this visit. Wt Readings from Last 3 Encounters:  05/28/22 218 lb 6.4 oz (99.1 kg)  11/26/21 217 lb 3.2 oz (98.5 kg)  03/23/21 208 lb (94.3 kg)    Physical Exam   Outpatient Encounter Medications as of 11/30/2022  Medication Sig   Calcipotriene-Betameth Diprop (ENSTILAR) 0.005-0.064 % FOAM Apply to scalp daily as needed for psoriasis. Avoid applying to face, groin, and axilla. Use as directed. Risk of skin atrophy with long-term use reviewed.   cholecalciferol (VITAMIN D) 1000 UNITS tablet Take 2,000 Units by mouth daily.   hydrochlorothiazide (MICROZIDE) 12.5 MG capsule TAKE 1 CAPSULE(12.5 MG) BY MOUTH DAILY   levothyroxine (SYNTHROID) 150 MCG tablet TAKE 1 TABLET(150 MCG) BY MOUTH DAILY BEFORE BREAKFAST   metoprolol succinate (TOPROL-XL) 50 MG 24 hr tablet TAKE 1 TABLET BY MOUTH DAILY, WITH OR IMMEDIATELY FOLLOWING MEALS   sertraline (ZOLOFT) 50 MG tablet TAKE 1 TABLET(50 MG) BY MOUTH  DAILY   triamcinolone cream (KENALOG) 0.1 % Apply 1 application topically 2 (two) times daily.   No facility-administered encounter medications on file as of 11/30/2022.     Lab Results  Component Value Date   WBC 4.2 07/21/2022   HGB 14.0 07/21/2022   HCT 42.0 07/21/2022   PLT 191 07/21/2022   GLUCOSE 98 07/21/2022   CHOL 204 (H) 07/21/2022   TRIG 68 07/21/2022   HDL 58 07/21/2022   LDLCALC 134 (H) 07/21/2022   ALT 19 07/21/2022   AST 19 07/21/2022   NA 141 07/21/2022   K 4.5 07/21/2022   CL 100 07/21/2022   CREATININE 0.73 07/21/2022   BUN 18 07/21/2022   CO2 29 07/21/2022   TSH 0.563 07/21/2022   HGBA1C 5.2 07/21/2022    MM 3D SCREEN BREAST BILATERAL  Result Date: 07/02/2022 CLINICAL DATA:  Screening. EXAM: DIGITAL SCREENING BILATERAL MAMMOGRAM WITH  TOMOSYNTHESIS AND CAD TECHNIQUE: Bilateral screening digital craniocaudal and mediolateral oblique mammograms were obtained. Bilateral screening digital breast tomosynthesis was performed. The images were evaluated with computer-aided detection. COMPARISON:  Previous exam(s). ACR Breast Density Category b: There are scattered areas of fibroglandular density. FINDINGS: There are no findings suspicious for malignancy. IMPRESSION: No mammographic evidence of malignancy. A result letter of this screening mammogram will be mailed directly to the patient. RECOMMENDATION: Screening mammogram in one year. (Code:SM-B-01Y) BI-RADS CATEGORY  1: Negative. Electronically Signed   By: Ammie Ferrier M.D.   On: 07/02/2022 11:26       Assessment & Plan:  There are no diagnoses linked to this encounter.   Einar Pheasant, MD

## 2022-11-30 NOTE — Progress Notes (Signed)
Patient ID: Jenna Winters, female   DOB: 1976-09-27, 47 y.o.   MRN: [id number] No showed for appt

## 2023-01-17 ENCOUNTER — Other Ambulatory Visit: Payer: Self-pay | Admitting: Internal Medicine

## 2023-01-17 DIAGNOSIS — I10 Essential (primary) hypertension: Secondary | ICD-10-CM

## 2023-01-28 ENCOUNTER — Encounter: Payer: Self-pay | Admitting: Internal Medicine

## 2023-01-28 DIAGNOSIS — E039 Hypothyroidism, unspecified: Secondary | ICD-10-CM

## 2023-01-28 MED ORDER — LEVOTHYROXINE SODIUM 150 MCG PO TABS: ORAL_TABLET | ORAL | 0 refills | Status: DC

## 2023-04-25 ENCOUNTER — Other Ambulatory Visit: Payer: Self-pay | Admitting: Internal Medicine

## 2023-04-28 ENCOUNTER — Other Ambulatory Visit: Payer: Self-pay | Admitting: Internal Medicine

## 2023-04-28 DIAGNOSIS — E039 Hypothyroidism, unspecified: Secondary | ICD-10-CM

## 2023-04-28 DIAGNOSIS — I10 Essential (primary) hypertension: Secondary | ICD-10-CM

## 2023-05-02 NOTE — Telephone Encounter (Signed)
Ok to do 90 with no refills 

## 2023-05-03 NOTE — Telephone Encounter (Signed)
Rx ok'd for her medications 90 days.  She has made f/u appt. Please notify - states was out of medication.

## 2023-06-07 ENCOUNTER — Ambulatory Visit: Payer: Managed Care, Other (non HMO) | Admitting: Internal Medicine

## 2023-06-14 ENCOUNTER — Encounter: Payer: Self-pay | Admitting: Internal Medicine

## 2023-06-14 ENCOUNTER — Ambulatory Visit: Payer: Managed Care, Other (non HMO) | Admitting: Internal Medicine

## 2023-06-14 VITALS — BP 114/70 | HR 65 | Temp 98.0°F | Resp 16 | Ht 67.0 in | Wt 233.0 lb

## 2023-06-14 DIAGNOSIS — R739 Hyperglycemia, unspecified: Secondary | ICD-10-CM

## 2023-06-14 DIAGNOSIS — I10 Essential (primary) hypertension: Secondary | ICD-10-CM | POA: Diagnosis not present

## 2023-06-14 DIAGNOSIS — K760 Fatty (change of) liver, not elsewhere classified: Secondary | ICD-10-CM

## 2023-06-14 DIAGNOSIS — E039 Hypothyroidism, unspecified: Secondary | ICD-10-CM | POA: Diagnosis not present

## 2023-06-14 DIAGNOSIS — Z9884 Bariatric surgery status: Secondary | ICD-10-CM

## 2023-06-14 DIAGNOSIS — E7800 Pure hypercholesterolemia, unspecified: Secondary | ICD-10-CM

## 2023-06-14 DIAGNOSIS — Z1211 Encounter for screening for malignant neoplasm of colon: Secondary | ICD-10-CM

## 2023-06-14 DIAGNOSIS — Z1231 Encounter for screening mammogram for malignant neoplasm of breast: Secondary | ICD-10-CM

## 2023-06-14 DIAGNOSIS — F439 Reaction to severe stress, unspecified: Secondary | ICD-10-CM

## 2023-06-14 MED ORDER — SERTRALINE HCL 50 MG PO TABS: ORAL_TABLET | ORAL | 1 refills | Status: DC

## 2023-06-14 MED ORDER — LEVOTHYROXINE SODIUM 150 MCG PO TABS: ORAL_TABLET | ORAL | 1 refills | Status: DC

## 2023-06-14 MED ORDER — HYDROCHLOROTHIAZIDE 12.5 MG PO CAPS: ORAL_CAPSULE | ORAL | 1 refills | Status: DC

## 2023-06-14 MED ORDER — METOPROLOL SUCCINATE ER 50 MG PO TB24: ORAL_TABLET | ORAL | 1 refills | Status: DC

## 2023-06-14 MED ORDER — TRIAMCINOLONE ACETONIDE 0.1 % EX CREA: 1.0000 | TOPICAL_CREAM | Freq: Two times a day (BID) | CUTANEOUS | 1 refills | Status: AC

## 2023-06-14 NOTE — Assessment & Plan Note (Signed)
S/p bariatric surgery.  Has lost weight. Continue diet and exercise.  Follow liver panel.

## 2023-06-14 NOTE — Assessment & Plan Note (Signed)
Continue zoloft.  Better since working from home.  Follow.

## 2023-06-14 NOTE — Progress Notes (Signed)
Subjective:    Patient ID: Jenna Winters, female    DOB: 1976-01-13, 47 y.o.   MRN: [id number]  Patient here for  Chief Complaint  Patient presents with   Medical Management of Chronic Issues    HPI Here to follow up regarding hypertension, stress and hypercholesterolemia.  She reports she is doing well. Not exercising as much, but does stay active.  No chest pain or sob reported.  No cough or congestion reported.  No abdominal pain.  Bowels moving daily - with metamucil.  Stress is better - working from home. Using mouthguard to sleep.  Feels sleeping well with this. Discussed colonoscopy and mammogram - will schedule.     Past Medical History:  Diagnosis Date   Depression    Hemorrhoid    History of basal cell carcinoma (BCC) 01/29/2019   left nasal supratip, Moh's at [facility]   Hypertension    Hyperthyroidism 11/23/2007   s/p ablation with resulting hypothyroidism   Spitz nevus 06/25/2021   right posterior thigh   Past Surgical History:  Procedure Laterality Date   ABDOMINAL HYSTERECTOMY  08/2011   BREAST BIOPSY Left 2015   core bx fibroadenoma   COLONOSCOPY     DILATION AND CURETTAGE OF UTERUS  12/2010   DILATION AND CURETTAGE OF UTERUS  1995   HEMORRHOID SURGERY  08/2013   Family History  Problem Relation Age of Onset   Hyperlipidemia Father    Hypertension Father    Diabetes Father    Hyperlipidemia Paternal Grandmother    Hypertension Paternal Grandmother    Diabetes Paternal Grandmother    Hyperlipidemia Paternal Grandfather    Hypertension Paternal Grandfather    Diabetes Paternal Grandfather    Lung cancer Paternal Grandfather    COPD Paternal Grandfather        lung   Breast cancer Neg Hx    Social History   Socioeconomic History   Marital status: Married    Spouse name: Not on file   Number of children: 1   Years of education: Not on file   Highest education level: Not on file  Occupational History   Occupation: Event organiser: [employer]   Tobacco Use   Smoking status: Never   Smokeless tobacco: Never  Substance and Sexual Activity   Alcohol use: Yes    Alcohol/week: 0.0 standard drinks of alcohol    Comment: rare occasion   Drug use: No   Sexual activity: Not on file  Other Topics Concern   Not on file  Social History Narrative   Not on file   Social Determinants of Health   Financial Resource Strain: Not on file  Food Insecurity: Not on file  Transportation Needs: Not on file  Physical Activity: Not on file  Stress: Not on file  Social Connections: Unknown (04/05/2022)   Received from [facility], [facility]   Social Network    Social Network: Not on file     Review of Systems  Constitutional:  Negative for appetite change and unexpected weight change.  HENT:  Negative for congestion and sinus pressure.   Respiratory:  Negative for cough, chest tightness and shortness of breath.   Cardiovascular:  Negative for chest pain, palpitations and leg swelling.  Gastrointestinal:  Negative for abdominal pain, diarrhea, nausea and vomiting.  Genitourinary:  Negative for difficulty urinating and dysuria.  Musculoskeletal:  Negative for joint swelling and myalgias.  Skin:  Negative for color change and rash.  Neurological:  Negative for dizziness and headaches.  Psychiatric/Behavioral:  Negative for agitation and dysphoric mood.        Objective:     BP 114/70   Pulse 65   Temp 98 F (36.7 C)   Resp 16   Ht 5\' 7"  (1.702 m)   Wt 233 lb (105.7 kg)   SpO2 98%   BMI 36.49 kg/m  Wt Readings from Last 3 Encounters:  06/14/23 233 lb (105.7 kg)  05/28/22 218 lb 6.4 oz (99.1 kg)  11/26/21 217 lb 3.2 oz (98.5 kg)    Physical Exam Vitals reviewed.  Constitutional:      General: She is not in acute distress.    Appearance: Normal appearance.  HENT:     Head: Normocephalic and atraumatic.     Right Ear: External ear normal.     Left Ear: External ear normal.     Mouth/Throat:     Pharynx: No  oropharyngeal exudate or posterior oropharyngeal erythema.  Eyes:     General: No scleral icterus.       Right eye: No discharge.        Left eye: No discharge.     Conjunctiva/sclera: Conjunctivae normal.  Neck:     Thyroid: No thyromegaly.  Cardiovascular:     Rate and Rhythm: Normal rate and regular rhythm.  Pulmonary:     Effort: No respiratory distress.     Breath sounds: Normal breath sounds. No wheezing.  Abdominal:     General: Bowel sounds are normal.     Palpations: Abdomen is soft.     Tenderness: There is no abdominal tenderness.  Musculoskeletal:        General: No swelling or tenderness.     Cervical back: Neck supple. No tenderness.  Lymphadenopathy:     Cervical: No cervical adenopathy.  Skin:    Findings: No erythema or rash.  Neurological:     Mental Status: She is alert.  Psychiatric:        Mood and Affect: Mood normal.        Behavior: Behavior normal.      Outpatient Encounter Medications as of 06/14/2023  Medication Sig   Calcipotriene-Betameth Diprop (ENSTILAR) 0.005-0.064 % FOAM Apply to scalp daily as needed for psoriasis. Avoid applying to face, groin, and axilla. Use as directed. Risk of skin atrophy with long-term use reviewed.   cholecalciferol (VITAMIN D) 1000 UNITS tablet Take 2,000 Units by mouth daily.   hydrochlorothiazide (MICROZIDE) 12.5 MG capsule TAKE 1 CAPSULE(12.5 MG) BY MOUTH DAILY   levothyroxine (SYNTHROID) 150 MCG tablet TAKE 1 TABLET(150 MCG) BY MOUTH DAILY BEFORE BREAKFAST   metoprolol succinate (TOPROL-XL) 50 MG 24 hr tablet TAKE 1 TABLET BY MOUTH DAILY, WITH OR IMMEDIATELY FOLLOWING MEALS   sertraline (ZOLOFT) 50 MG tablet TAKE 1 TABLET(50 MG) BY MOUTH DAILY   triamcinolone cream (KENALOG) 0.1 % Apply 1 Application topically 2 (two) times daily.   [DISCONTINUED] hydrochlorothiazide (MICROZIDE) 12.5 MG capsule TAKE 1 CAPSULE(12.5 MG) BY MOUTH DAILY   [DISCONTINUED] levothyroxine (SYNTHROID) 150 MCG tablet TAKE 1 TABLET(150 MCG)  BY MOUTH DAILY BEFORE BREAKFAST   [DISCONTINUED] metoprolol succinate (TOPROL-XL) 50 MG 24 hr tablet TAKE 1 TABLET BY MOUTH DAILY, WITH OR IMMEDIATELY FOLLOWING MEALS   [DISCONTINUED] sertraline (ZOLOFT) 50 MG tablet TAKE 1 TABLET(50 MG) BY MOUTH DAILY   [DISCONTINUED] triamcinolone cream (KENALOG) 0.1 % Apply 1 application topically 2 (two) times daily.   No facility-administered encounter medications on file as of 06/14/2023.  Lab Results  Component Value Date   WBC 4.2 07/21/2022   HGB 14.0 07/21/2022   HCT 42.0 07/21/2022   PLT 191 07/21/2022   GLUCOSE 98 07/21/2022   CHOL 204 (H) 07/21/2022   TRIG 68 07/21/2022   HDL 58 07/21/2022   LDLCALC 134 (H) 07/21/2022   ALT 19 07/21/2022   AST 19 07/21/2022   NA 141 07/21/2022   K 4.5 07/21/2022   CL 100 07/21/2022   CREATININE 0.73 07/21/2022   BUN 18 07/21/2022   CO2 29 07/21/2022   TSH 0.563 07/21/2022   HGBA1C 5.2 07/21/2022    MM 3D SCREEN BREAST BILATERAL  Result Date: 07/02/2022 CLINICAL DATA:  Screening. EXAM: DIGITAL SCREENING BILATERAL MAMMOGRAM WITH TOMOSYNTHESIS AND CAD TECHNIQUE: Bilateral screening digital craniocaudal and mediolateral oblique mammograms were obtained. Bilateral screening digital breast tomosynthesis was performed. The images were evaluated with computer-aided detection. COMPARISON:  Previous exam(s). ACR Breast Density Category b: There are scattered areas of fibroglandular density. FINDINGS: There are no findings suspicious for malignancy. IMPRESSION: No mammographic evidence of malignancy. A result letter of this screening mammogram will be mailed directly to the patient. RECOMMENDATION: Screening mammogram in one year. (Code:SM-B-01Y) BI-RADS CATEGORY  1: Negative. Electronically Signed   By: Frederico Hamman M.D.   On: 07/02/2022 11:26       Assessment & Plan:  Colon cancer screening -     Ambulatory referral to Gastroenterology  Visit for screening mammogram -     3D Screening Mammogram,  Left and Right; Future  Essential hypertension, benign Assessment & Plan: Continue metoprolol and hydrochlorothiazide. Blood pressure doing well.  Follow pressures.  Follow metabolic panel.    Hypothyroidism (acquired) Assessment & Plan: On thyroid replacement.  Follow tsh.     Fatty infiltration of liver Assessment & Plan: S/p bariatric surgery.  Has lost weight. Continue diet and exercise.  Follow liver panel.    History of gastric bypass Assessment & Plan: S/p bariatric surgery.  Has done well. Lost weight.  Feels better.  Follow cbc and iron studies. Plans to get back into her exercise routine.   Orders: -     Ferritin -     CBC with Differential/Platelet  Hypercholesterolemia Assessment & Plan: Low cholesterol diet and exercise. Check lipid panel.    Hyperglycemia Assessment & Plan: Low carb diet and exercise.  Check met b and a1c.    Stress Assessment & Plan: Continue zoloft.  Better since working from home.  Follow.    Other orders -     hydroCHLOROthiazide; TAKE 1 CAPSULE(12.5 MG) BY MOUTH DAILY  Dispense: 90 capsule; Refill: 1 -     Levothyroxine Sodium; TAKE 1 TABLET(150 MCG) BY MOUTH DAILY BEFORE BREAKFAST  Dispense: 90 tablet; Refill: 1 -     Metoprolol Succinate ER; TAKE 1 TABLET BY MOUTH DAILY, WITH OR IMMEDIATELY FOLLOWING MEALS  Dispense: 90 tablet; Refill: 1 -     Sertraline HCl; TAKE 1 TABLET(50 MG) BY MOUTH DAILY  Dispense: 90 tablet; Refill: 1 -     Triamcinolone Acetonide; Apply 1 Application topically 2 (two) times daily.  Dispense: 30 g; Refill: 1     Dale Comstock, MD

## 2023-06-14 NOTE — Assessment & Plan Note (Signed)
On thyroid replacement.  Follow tsh.  

## 2023-06-14 NOTE — Assessment & Plan Note (Signed)
Continue metoprolol and hydrochlorothiazide. Blood pressure doing well.  Follow pressures.  Follow metabolic panel.

## 2023-06-14 NOTE — Assessment & Plan Note (Signed)
Low cholesterol diet and exercise.  Check lipid panel.   

## 2023-06-14 NOTE — Assessment & Plan Note (Signed)
Low carb diet and exercise.  Check met b and a1c.   

## 2023-06-14 NOTE — Assessment & Plan Note (Signed)
S/p bariatric surgery.  Has done well. Lost weight.  Feels better.  Follow cbc and iron studies. Plans to get back into her exercise routine.

## 2023-07-12 ENCOUNTER — Telehealth: Payer: Self-pay | Admitting: Internal Medicine

## 2023-07-12 DIAGNOSIS — D72819 Decreased white blood cell count, unspecified: Secondary | ICD-10-CM

## 2023-07-12 LAB — HEMOGLOBIN A1C: Hgb A1c MFr Bld: 5.3 % (ref 4.8–5.6)

## 2023-07-12 LAB — LIPID PANEL
Cholesterol, Total: 189 mg/dL (ref 100–199)
HDL: 48 mg/dL (ref >39)

## 2023-07-12 LAB — FERRITIN: Ferritin: 98 ng/mL (ref 15–150)

## 2023-07-12 LAB — CBC WITH DIFFERENTIAL/PLATELET: RBC: 4.6 x10E6/uL (ref 3.77–5.28)

## 2023-07-12 NOTE — Telephone Encounter (Signed)
Order placed for f/u lab - ordered for [employer].  See 07/12/23 result note for notification.

## 2023-07-24 ENCOUNTER — Other Ambulatory Visit: Payer: Self-pay | Admitting: Internal Medicine

## 2023-08-04 ENCOUNTER — Telehealth: Payer: Self-pay | Admitting: Internal Medicine

## 2023-08-04 NOTE — Telephone Encounter (Signed)
Jenna Winters from [facility] called stating the manufacturer changed for the levothyroxine and Jenna Winters need approval for the change

## 2023-08-05 ENCOUNTER — Encounter: Payer: Self-pay | Admitting: *Deleted

## 2023-08-25 NOTE — Telephone Encounter (Signed)
LMTCB

## 2023-09-30 ENCOUNTER — Encounter: Payer: Self-pay | Admitting: Internal Medicine

## 2023-11-22 ENCOUNTER — Encounter: Payer: Self-pay | Admitting: Internal Medicine

## 2023-11-23 NOTE — Telephone Encounter (Signed)
 Reviewed. Need more information for clarification. Will need medical reason for each - for massage therapy.  May need to be documented.

## 2023-12-15 ENCOUNTER — Ambulatory Visit: Payer: Managed Care, Other (non HMO) | Admitting: Internal Medicine

## 2023-12-15 ENCOUNTER — Ambulatory Visit
Admission: RE | Admit: 2023-12-15 | Discharge: 2023-12-15 | Disposition: A | Discharge status: Home / Self Care | Payer: Managed Care, Other (non HMO) | Source: Ambulatory Visit | Attending: Internal Medicine | Admitting: Internal Medicine

## 2023-12-15 ENCOUNTER — Other Ambulatory Visit: Payer: Self-pay | Admitting: Internal Medicine

## 2023-12-15 ENCOUNTER — Encounter: Payer: Self-pay | Admitting: Internal Medicine

## 2023-12-15 VITALS — BP 110/80 | HR 70 | Temp 98.5°F | Resp 17 | Ht 67.5 in | Wt 226.1 lb

## 2023-12-15 DIAGNOSIS — I10 Essential (primary) hypertension: Secondary | ICD-10-CM

## 2023-12-15 DIAGNOSIS — K760 Fatty (change of) liver, not elsewhere classified: Secondary | ICD-10-CM

## 2023-12-15 DIAGNOSIS — Z1231 Encounter for screening mammogram for malignant neoplasm of breast: Secondary | ICD-10-CM | POA: Diagnosis present

## 2023-12-15 DIAGNOSIS — Z23 Encounter for immunization: Secondary | ICD-10-CM | POA: Diagnosis not present

## 2023-12-15 DIAGNOSIS — E7800 Pure hypercholesterolemia, unspecified: Secondary | ICD-10-CM | POA: Diagnosis not present

## 2023-12-15 DIAGNOSIS — M542 Cervicalgia: Secondary | ICD-10-CM

## 2023-12-15 DIAGNOSIS — E039 Hypothyroidism, unspecified: Secondary | ICD-10-CM

## 2023-12-15 DIAGNOSIS — Z0000 Encounter for general adult medical examination without abnormal findings: Secondary | ICD-10-CM | POA: Diagnosis not present

## 2023-12-15 DIAGNOSIS — R739 Hyperglycemia, unspecified: Secondary | ICD-10-CM

## 2023-12-15 DIAGNOSIS — F439 Reaction to severe stress, unspecified: Secondary | ICD-10-CM

## 2023-12-15 MED ORDER — METOPROLOL SUCCINATE ER 50 MG PO TB24: ORAL_TABLET | ORAL | 1 refills | Status: DC

## 2023-12-15 MED ORDER — HYDROCHLOROTHIAZIDE 12.5 MG PO CAPS: ORAL_CAPSULE | ORAL | 1 refills | Status: DC

## 2023-12-15 MED ORDER — LEVOTHYROXINE SODIUM 150 MCG PO TABS: ORAL_TABLET | ORAL | 1 refills | Status: DC

## 2023-12-15 NOTE — Progress Notes (Signed)
Subjective:    Patient ID: Jenna Winters, female    DOB: 1975-11-25, 48 y.o.   MRN: [id number]  Patient here for  Chief Complaint  Patient presents with   Annual Exam    CPE    HPI Here for a physical exam reports she is doing relatively well. Has joined [employer].  On zepbound. Just started recently. Tolerating. Discussed diet and exercise. Not exercising regularly. Uses a stand up desk at work. Increased stress at work. Overall she feels she is handling things relatively well. Breathing stable. Blood pressures doing well - 120/78, 113/78. No chest pain reported. No abdominal pain or bowel change reported. Does report some increased neck/shoulder pain. Massage has helped previously.     Past Medical History:  Diagnosis Date   Depression    Hemorrhoid    History of basal cell carcinoma (BCC) 01/29/2019   left nasal supratip, Moh's at [facility]   Hypertension    Hyperthyroidism 11/23/2007   s/p ablation with resulting hypothyroidism   Spitz nevus 06/25/2021   right posterior thigh   Past Surgical History:  Procedure Laterality Date   ABDOMINAL HYSTERECTOMY  08/2011   BREAST BIOPSY Left 2015   core bx fibroadenoma   COLONOSCOPY     DILATION AND CURETTAGE OF UTERUS  12/2010   DILATION AND CURETTAGE OF UTERUS  1995   HEMORRHOID SURGERY  08/2013   Family History  Problem Relation Age of Onset   Hyperlipidemia Father    Hypertension Father    Diabetes Father    Hyperlipidemia Paternal Grandmother    Hypertension Paternal Grandmother    Diabetes Paternal Grandmother    Hyperlipidemia Paternal Grandfather    Hypertension Paternal Grandfather    Diabetes Paternal Grandfather    Lung cancer Paternal Grandfather    COPD Paternal Grandfather        lung   Breast cancer Neg Hx    Social History   Socioeconomic History   Marital status: Married    Spouse name: Not on file   Number of children: 1   Years of education: Not on file   Highest education level: Not on file   Occupational History   Occupation: Event organiser: [employer]  Tobacco Use   Smoking status: Never   Smokeless tobacco: Never  Substance and Sexual Activity   Alcohol use: Yes    Alcohol/week: 0.0 standard drinks of alcohol    Comment: rare occasion   Drug use: No   Sexual activity: Not on file  Other Topics Concern   Not on file  Social History Narrative   Not on file   Social Drivers of Health   Financial Resource Strain: Not on file  Food Insecurity: Not on file  Transportation Needs: Not on file  Physical Activity: Not on file  Stress: Not on file  Social Connections: Unknown (04/05/2022)   Received from [facility], [facility]   Social Network    Social Network: Not on file     Review of Systems  Constitutional:  Negative for appetite change and unexpected weight change.  HENT:  Negative for congestion and sinus pressure.   Respiratory:  Negative for cough, chest tightness and shortness of breath.   Cardiovascular:  Negative for chest pain, palpitations and leg swelling.  Gastrointestinal:  Negative for abdominal pain, diarrhea, nausea and vomiting.  Genitourinary:  Negative for difficulty urinating and dysuria.  Musculoskeletal:  Positive for neck pain. Negative for joint swelling and myalgias.  Skin:  Negative for color change and rash.  Neurological:  Negative for dizziness and headaches.  Psychiatric/Behavioral:  Negative for agitation and dysphoric mood.        Objective:     BP 110/80 (Cuff Size: Normal)   Pulse 70   Temp 98.5 F (36.9 C) (Oral)   Resp 17   Ht 5' 7.5" (1.715 m)   Wt 226 lb 2 oz (102.6 kg)   SpO2 97%   BMI 34.89 kg/m  Wt Readings from Last 3 Encounters:  12/15/23 226 lb 2 oz (102.6 kg)  06/14/23 233 lb (105.7 kg)  05/28/22 218 lb 6.4 oz (99.1 kg)    Physical Exam Vitals reviewed.  Constitutional:      General: She is not in acute distress.    Appearance: Normal appearance.  HENT:     Head: Normocephalic and  atraumatic.     Right Ear: External ear normal.     Left Ear: External ear normal.     Mouth/Throat:     Pharynx: No oropharyngeal exudate or posterior oropharyngeal erythema.  Eyes:     General: No scleral icterus.       Right eye: No discharge.        Left eye: No discharge.     Conjunctiva/sclera: Conjunctivae normal.  Neck:     Thyroid: No thyromegaly.  Cardiovascular:     Rate and Rhythm: Normal rate and regular rhythm.  Pulmonary:     Effort: No respiratory distress.     Breath sounds: Normal breath sounds. No wheezing.  Abdominal:     General: Bowel sounds are normal.     Palpations: Abdomen is soft.     Tenderness: There is no abdominal tenderness.  Musculoskeletal:        General: No swelling or tenderness.     Cervical back: Neck supple. No tenderness.  Lymphadenopathy:     Cervical: No cervical adenopathy.  Skin:    Findings: No erythema or rash.  Neurological:     Mental Status: She is alert.  Psychiatric:        Mood and Affect: Mood normal.        Behavior: Behavior normal.         Outpatient Encounter Medications as of 12/15/2023  Medication Sig   Calcipotriene-Betameth Diprop (ENSTILAR) 0.005-0.064 % FOAM Apply to scalp daily as needed for psoriasis. Avoid applying to face, groin, and axilla. Use as directed. Risk of skin atrophy with long-term use reviewed.   cholecalciferol (VITAMIN D) 1000 UNITS tablet Take 2,000 Units by mouth daily.   sertraline (ZOLOFT) 50 MG tablet TAKE 1 TABLET(50 MG) BY MOUTH DAILY   triamcinolone cream (KENALOG) 0.1 % Apply 1 Application topically 2 (two) times daily.   ZEPBOUND 2.5 MG/0.5ML Pen Inject 2.5 mg into the skin once a week.   [DISCONTINUED] hydrochlorothiazide (MICROZIDE) 12.5 MG capsule TAKE 1 CAPSULE(12.5 MG) BY MOUTH DAILY   [DISCONTINUED] levothyroxine (SYNTHROID) 150 MCG tablet TAKE 1 TABLET(150 MCG) BY MOUTH DAILY BEFORE BREAKFAST   [DISCONTINUED] metoprolol succinate (TOPROL-XL) 50 MG 24 hr tablet TAKE 1  TABLET BY MOUTH DAILY, WITH OR IMMEDIATELY FOLLOWING MEALS   hydrochlorothiazide (MICROZIDE) 12.5 MG capsule TAKE 1 CAPSULE(12.5 MG) BY MOUTH DAILY   levothyroxine (SYNTHROID) 150 MCG tablet TAKE 1 TABLET(150 MCG) BY MOUTH DAILY BEFORE BREAKFAST   metoprolol succinate (TOPROL-XL) 50 MG 24 hr tablet TAKE 1 TABLET BY MOUTH DAILY, WITH OR IMMEDIATELY FOLLOWING MEALS   No facility-administered encounter medications on file as of  12/15/2023.     Lab Results  Component Value Date   WBC 3.3 (L) 07/11/2023   HGB 13.6 07/11/2023   HCT 42.1 07/11/2023   PLT 174 07/11/2023   GLUCOSE 84 07/11/2023   CHOL 189 07/11/2023   TRIG 81 07/11/2023   HDL 48 07/11/2023   LDLCALC 126 (H) 07/11/2023   ALT 16 07/11/2023   AST 22 07/11/2023   NA 140 07/11/2023   K 4.8 07/11/2023   CL 101 07/11/2023   CREATININE 0.68 07/11/2023   BUN 17 07/11/2023   CO2 27 07/11/2023   TSH 0.453 07/11/2023   HGBA1C 5.3 07/11/2023    MM 3D SCREEN BREAST BILATERAL Result Date: 07/02/2022 CLINICAL DATA:  Screening. EXAM: DIGITAL SCREENING BILATERAL MAMMOGRAM WITH TOMOSYNTHESIS AND CAD TECHNIQUE: Bilateral screening digital craniocaudal and mediolateral oblique mammograms were obtained. Bilateral screening digital breast tomosynthesis was performed. The images were evaluated with computer-aided detection. COMPARISON:  Previous exam(s). ACR Breast Density Category b: There are scattered areas of fibroglandular density. FINDINGS: There are no findings suspicious for malignancy. IMPRESSION: No mammographic evidence of malignancy. A result letter of this screening mammogram will be mailed directly to the patient. RECOMMENDATION: Screening mammogram in one year. (Code:SM-B-01Y) BI-RADS CATEGORY  1: Negative. Electronically Signed   By: Frederico Hamman M.D.   On: 07/02/2022 11:26       Assessment & Plan:  Hypothyroidism (acquired) Assessment & Plan: On thyroid replacement.  Follow tsh.    Orders: -      TSH  Hyperglycemia Assessment & Plan: Low carb diet and exercise.  Check met b and a1c.   Orders: -     Basic metabolic panel -     Hemoglobin A1c  Hypercholesterolemia Assessment & Plan: Low cholesterol diet and exercise. Check lipid panel.   Orders: -     CBC with Differential/Platelet -     Hepatic function panel -     Lipid panel  Health care maintenance Assessment & Plan: Physical today 12/15/23.  PAP 11/26/21 negative with negative HPV.  S/p hysterectomy.  Mammogram 07/01/22 - Birads I.  Overdue mammogram. Scheduled today. States had colonoscopy [facility]).  Obtain results.    Encounter for screening mammogram for malignant neoplasm of breast -     3D Screening Mammogram, Left and Right; Future  Need for immunization against influenza -     Flu vaccine trivalent PF, 6mos and older(Flulaval,Afluria,Fluarix,Fluzone)  Stress Assessment & Plan: Continue zoloft.  Overall she feels she is handling things relatively well.    Fatty infiltration of liver Assessment & Plan: S/p bariatric surgery.  Continue diet and exercise.  On zepbound now. Follow liver panel.    Essential hypertension, benign Assessment & Plan: Continue metoprolol and hydrochlorothiazide. Blood pressure doing well.  Follow pressures.  Follow metabolic panel.    Neck pain Assessment & Plan: Neck and shoulder pain as outlined. Has done well previously with massage therapy.    Other orders -     hydroCHLOROthiazide; TAKE 1 CAPSULE(12.5 MG) BY MOUTH DAILY  Dispense: 90 capsule; Refill: 1 -     Levothyroxine Sodium; TAKE 1 TABLET(150 MCG) BY MOUTH DAILY BEFORE BREAKFAST  Dispense: 90 tablet; Refill: 1 -     Metoprolol Succinate ER; TAKE 1 TABLET BY MOUTH DAILY, WITH OR IMMEDIATELY FOLLOWING MEALS  Dispense: 90 tablet; Refill: 1     Dale Crivitz, MD

## 2023-12-15 NOTE — Assessment & Plan Note (Addendum)
Physical today 12/15/23.  PAP 11/26/21 negative with negative HPV.  S/p hysterectomy.  Mammogram 07/01/22 - Birads I.  Overdue mammogram. Scheduled today. States had colonoscopy [facility]).  Obtain results.

## 2023-12-18 ENCOUNTER — Encounter: Payer: Self-pay | Admitting: Internal Medicine

## 2023-12-18 DIAGNOSIS — M542 Cervicalgia: Secondary | ICD-10-CM | POA: Insufficient documentation

## 2023-12-18 NOTE — Assessment & Plan Note (Signed)
Continue metoprolol and hydrochlorothiazide. Blood pressure doing well.  Follow pressures.  Follow metabolic panel.

## 2023-12-18 NOTE — Assessment & Plan Note (Signed)
Neck and shoulder pain as outlined. Has done well previously with massage therapy.

## 2023-12-18 NOTE — Assessment & Plan Note (Signed)
Continue zoloft.  Overall she feels she is handling things relatively well.

## 2023-12-18 NOTE — Assessment & Plan Note (Addendum)
S/p bariatric surgery.  Continue diet and exercise.  On zepbound now. Follow liver panel.

## 2023-12-18 NOTE — Assessment & Plan Note (Signed)
On thyroid replacement.  Follow tsh.

## 2023-12-18 NOTE — Assessment & Plan Note (Signed)
Low carb diet and exercise.  Check met b and a1c.

## 2023-12-18 NOTE — Assessment & Plan Note (Signed)
Low cholesterol diet and exercise.  Check lipid panel.

## 2023-12-19 NOTE — Telephone Encounter (Signed)
Please refuse. Was refilled at her appt

## 2024-01-21 ENCOUNTER — Other Ambulatory Visit: Payer: Self-pay | Admitting: Internal Medicine

## 2024-02-29 ENCOUNTER — Other Ambulatory Visit: Payer: Self-pay | Admitting: Internal Medicine

## 2024-04-13 ENCOUNTER — Encounter: Payer: Self-pay | Admitting: Internal Medicine

## 2024-04-13 ENCOUNTER — Ambulatory Visit: Payer: Managed Care, Other (non HMO) | Admitting: Internal Medicine

## 2024-04-13 VITALS — BP 106/78 | HR 75 | Ht 67.5 in | Wt 201.0 lb

## 2024-04-13 DIAGNOSIS — I10 Essential (primary) hypertension: Secondary | ICD-10-CM | POA: Diagnosis not present

## 2024-04-13 DIAGNOSIS — Z1211 Encounter for screening for malignant neoplasm of colon: Secondary | ICD-10-CM | POA: Insufficient documentation

## 2024-04-13 DIAGNOSIS — R739 Hyperglycemia, unspecified: Secondary | ICD-10-CM

## 2024-04-13 DIAGNOSIS — E039 Hypothyroidism, unspecified: Secondary | ICD-10-CM

## 2024-04-13 DIAGNOSIS — K760 Fatty (change of) liver, not elsewhere classified: Secondary | ICD-10-CM

## 2024-04-13 DIAGNOSIS — E7800 Pure hypercholesterolemia, unspecified: Secondary | ICD-10-CM

## 2024-04-13 DIAGNOSIS — L989 Disorder of the skin and subcutaneous tissue, unspecified: Secondary | ICD-10-CM | POA: Insufficient documentation

## 2024-04-13 MED ORDER — HYDROCHLOROTHIAZIDE 12.5 MG PO CAPS: ORAL_CAPSULE | ORAL | 1 refills | Status: DC

## 2024-04-13 MED ORDER — LEVOTHYROXINE SODIUM 150 MCG PO TABS: ORAL_TABLET | ORAL | 1 refills | Status: DC

## 2024-04-13 MED ORDER — METOPROLOL SUCCINATE ER 50 MG PO TB24: ORAL_TABLET | ORAL | 1 refills | Status: DC

## 2024-04-13 NOTE — Assessment & Plan Note (Signed)
 Per report - due colonoscopy. Referr placed.

## 2024-04-13 NOTE — Assessment & Plan Note (Signed)
 Continue diet and exercise.  On zepbound now. Follow liver panel. Check today.

## 2024-04-13 NOTE — Assessment & Plan Note (Signed)
 Has adjusted diet. Lost weight. Check met b and A1c today.

## 2024-04-13 NOTE — Assessment & Plan Note (Signed)
 Continues on synthroid.   Check tsh today.

## 2024-04-13 NOTE — Progress Notes (Signed)
 Subjective:    Patient ID: Jenna Winters, female    DOB: 03/21/1976, 48 y.o.   MRN: [id number]  Patient here for  Chief Complaint  Patient presents with   Medical Management of Chronic Issues    4 month follow up     HPI Here for a scheduled follow up - follow up regarding hypertension and hypercholesterolemia. Has been on zepbound. Tolerating. Continuing to lose weight. Taking fiber - keeping bowels moving. Continues on zoloft . Discussed exercise. Some hot flashes. Sporadic. No chest pain or sob reported. No abdominal pain. Blood pressure - doing well. Facial lesion. Request referral to dermatology. Due colonoscopy.   Past Medical History:  Diagnosis Date   Depression    Hemorrhoid    History of basal cell carcinoma (BCC) 01/29/2019   left nasal supratip, Moh's at [facility]   Hypertension    Hyperthyroidism 11/23/2007   s/p ablation with resulting hypothyroidism   Spitz nevus 06/25/2021   right posterior thigh   Past Surgical History:  Procedure Laterality Date   ABDOMINAL HYSTERECTOMY  08/2011   BREAST BIOPSY Left 2015   core bx fibroadenoma   COLONOSCOPY     DILATION AND CURETTAGE OF UTERUS  12/2010   DILATION AND CURETTAGE OF UTERUS  1995   HEMORRHOID SURGERY  08/2013   Family History  Problem Relation Age of Onset   Hyperlipidemia Father    Hypertension Father    Diabetes Father    Hyperlipidemia Paternal Grandmother    Hypertension Paternal Grandmother    Diabetes Paternal Grandmother    Hyperlipidemia Paternal Grandfather    Hypertension Paternal Grandfather    Diabetes Paternal Grandfather    Lung cancer Paternal Grandfather    COPD Paternal Grandfather        lung   Breast cancer Neg Hx    Social History   Socioeconomic History   Marital status: Married    Spouse name: Not on file   Number of children: 1   Years of education: Not on file   Highest education level: Not on file  Occupational History   Occupation: Event organiser: [employer]   Tobacco Use   Smoking status: Never   Smokeless tobacco: Never  Substance and Sexual Activity   Alcohol use: Yes    Alcohol/week: 0.0 standard drinks of alcohol    Comment: rare occasion   Drug use: No   Sexual activity: Not on file  Other Topics Concern   Not on file  Social History Narrative   Not on file   Social Drivers of Health   Financial Resource Strain: Not on file  Food Insecurity: Not on file  Transportation Needs: Not on file  Physical Activity: Not on file  Stress: Not on file  Social Connections: Unknown (04/05/2022)   Received from [facility]   Social Network    Social Network: Not on file     Review of Systems  Constitutional:  Negative for appetite change and unexpected weight change.  HENT:  Negative for congestion and sinus pressure.   Respiratory:  Negative for cough, chest tightness and shortness of breath.   Cardiovascular:  Negative for chest pain, palpitations and leg swelling.  Gastrointestinal:  Negative for abdominal pain, diarrhea, nausea and vomiting.  Genitourinary:  Negative for difficulty urinating and dysuria.  Musculoskeletal:  Negative for joint swelling and myalgias.  Skin:  Negative for color change and rash.  Neurological:  Negative for dizziness and headaches.  Psychiatric/Behavioral:  Negative for agitation and dysphoric mood.        Objective:     BP 106/78   Pulse 75   Ht 5' 7.5" (1.715 m)   Wt 201 lb (91.2 kg)   SpO2 97%   BMI 31.02 kg/m  Wt Readings from Last 3 Encounters:  04/13/24 201 lb (91.2 kg)  12/15/23 226 lb 2 oz (102.6 kg)  06/14/23 233 lb (105.7 kg)    Physical Exam Vitals reviewed.  Constitutional:      General: She is not in acute distress.    Appearance: Normal appearance.  HENT:     Head: Normocephalic and atraumatic.     Right Ear: External ear normal.     Left Ear: External ear normal.     Mouth/Throat:     Pharynx: No oropharyngeal exudate or posterior oropharyngeal  erythema.  Eyes:     General: No scleral icterus.       Right eye: No discharge.        Left eye: No discharge.     Conjunctiva/sclera: Conjunctivae normal.  Neck:     Thyroid : No thyromegaly.  Cardiovascular:     Rate and Rhythm: Normal rate and regular rhythm.  Pulmonary:     Effort: No respiratory distress.     Breath sounds: Normal breath sounds. No wheezing.  Abdominal:     General: Bowel sounds are normal.     Palpations: Abdomen is soft.     Tenderness: There is no abdominal tenderness.  Musculoskeletal:        General: No swelling or tenderness.     Cervical back: Neck supple. No tenderness.  Lymphadenopathy:     Cervical: No cervical adenopathy.  Skin:    Findings: No erythema or rash.  Neurological:     Mental Status: She is alert.  Psychiatric:        Mood and Affect: Mood normal.        Behavior: Behavior normal.         Outpatient Encounter Medications as of 04/13/2024  Medication Sig   Calcipotriene-Betameth Diprop (ENSTILAR ) 0.005-0.064 % FOAM Apply to scalp daily as needed for psoriasis. Avoid applying to face, groin, and axilla. Use as directed. Risk of skin atrophy with long-term use reviewed.   cholecalciferol (VITAMIN D ) 1000 UNITS tablet Take 2,000 Units by mouth daily.   sertraline  (ZOLOFT ) 50 MG tablet TAKE 1 TABLET BY MOUTH DAILY   triamcinolone  cream (KENALOG ) 0.1 % Apply 1 Application topically 2 (two) times daily.   ZEPBOUND 7.5 MG/0.5ML Pen Inject 7.5 mg into the skin once a week.   [DISCONTINUED] hydrochlorothiazide  (MICROZIDE ) 12.5 MG capsule TAKE 1 CAPSULE(12.5 MG) BY MOUTH DAILY   [DISCONTINUED] levothyroxine  (SYNTHROID ) 150 MCG tablet TAKE 1 TABLET(150 MCG) BY MOUTH DAILY BEFORE BREAKFAST   [DISCONTINUED] metoprolol  succinate (TOPROL -XL) 50 MG 24 hr tablet TAKE 1 TABLET BY MOUTH DAILY, WITH OR IMMEDIATELY FOLLOWING MEALS   hydrochlorothiazide  (MICROZIDE ) 12.5 MG capsule TAKE 1 CAPSULE(12.5 MG) BY MOUTH DAILY   levothyroxine  (SYNTHROID ) 150  MCG tablet TAKE 1 TABLET(150 MCG) BY MOUTH DAILY BEFORE BREAKFAST   metoprolol  succinate (TOPROL -XL) 50 MG 24 hr tablet TAKE 1 TABLET BY MOUTH DAILY, WITH OR IMMEDIATELY FOLLOWING MEALS   [DISCONTINUED] ZEPBOUND 2.5 MG/0.5ML Pen Inject 2.5 mg into the skin once a week. (Patient not taking: Reported on 04/13/2024)   No facility-administered encounter medications on file as of 04/13/2024.     Lab Results  Component Value Date   WBC 3.3 (L) 07/11/2023  HGB 13.6 07/11/2023   HCT 42.1 07/11/2023   PLT 174 07/11/2023   GLUCOSE 84 07/11/2023   CHOL 189 07/11/2023   TRIG 81 07/11/2023   HDL 48 07/11/2023   LDLCALC 126 (H) 07/11/2023   ALT 16 07/11/2023   AST 22 07/11/2023   NA 140 07/11/2023   K 4.8 07/11/2023   CL 101 07/11/2023   CREATININE 0.68 07/11/2023   BUN 17 07/11/2023   CO2 27 07/11/2023   TSH 0.453 07/11/2023   HGBA1C 5.3 07/11/2023    MM 3D SCREENING MAMMOGRAM BILATERAL BREAST Result Date: 12/16/2023 CLINICAL DATA:  Screening. EXAM: DIGITAL SCREENING BILATERAL MAMMOGRAM WITH TOMOSYNTHESIS AND CAD TECHNIQUE: Bilateral screening digital craniocaudal and mediolateral oblique mammograms were obtained. Bilateral screening digital breast tomosynthesis was performed. The images were evaluated with computer-aided detection. COMPARISON:  Previous exam(s). ACR Breast Density Category a: The breasts are almost entirely fatty. FINDINGS: There are no findings suspicious for malignancy. IMPRESSION: No mammographic evidence of malignancy. A result letter of this screening mammogram will be mailed directly to the patient. RECOMMENDATION: Screening mammogram in one year. (Code:SM-B-01Y) BI-RADS CATEGORY  1: Negative. Electronically Signed   By: Alinda Apley M.D.   On: 12/16/2023 13:25       Assessment & Plan:  Colon cancer screening -     Ambulatory referral to Gastroenterology  Essential hypertension, benign Assessment & Plan: Continue metoprolol  and hydrochlorothiazide . Blood  pressure doing well.  Follow pressures.  No changes in medication. Check metabolic panel today.   Orders: -     Basic metabolic panel with GFR  Hypothyroidism (acquired) Assessment & Plan: Continues on synthroid . Check tsh today.   Orders: -     TSH  Hyperglycemia Assessment & Plan: Has adjusted diet. Lost weight. Check met b and A1c today.   Orders: -     Hemoglobin A1c -     Basic metabolic panel with GFR  Hypercholesterolemia Assessment & Plan: Low cholesterol diet and exercise. Has adjusted diet. Lost weight. Check lipid panel today.   Orders: -     Lipid panel -     Hepatic function panel -     CBC with Differential/Platelet  Skin lesion of face -     Ambulatory referral to Dermatology  Fatty infiltration of liver Assessment & Plan: Continue diet and exercise.  On zepbound now. Follow liver panel. Check today.    Skin lesion Assessment & Plan: Facial lesion. Refer to dermatology for further evaluation.    Screen for colon cancer Assessment & Plan: Per report - due colonoscopy. Referr placed.    Other orders -     hydroCHLOROthiazide ; TAKE 1 CAPSULE(12.5 MG) BY MOUTH DAILY  Dispense: 90 capsule; Refill: 1 -     Levothyroxine  Sodium; TAKE 1 TABLET(150 MCG) BY MOUTH DAILY BEFORE BREAKFAST  Dispense: 90 tablet; Refill: 1 -     Metoprolol  Succinate ER; TAKE 1 TABLET BY MOUTH DAILY, WITH OR IMMEDIATELY FOLLOWING MEALS  Dispense: 90 tablet; Refill: 1     Dellar Fenton, MD

## 2024-04-13 NOTE — Assessment & Plan Note (Signed)
 Facial lesion. Refer to dermatology for further evaluation.

## 2024-04-13 NOTE — Assessment & Plan Note (Signed)
 Low cholesterol diet and exercise. Has adjusted diet. Lost weight. Check lipid panel today.

## 2024-04-13 NOTE — Assessment & Plan Note (Signed)
 Continue metoprolol  and hydrochlorothiazide . Blood pressure doing well.  Follow pressures.  No changes in medication. Check metabolic panel today.

## 2024-04-14 LAB — BASIC METABOLIC PANEL WITH GFR
BUN/Creatinine Ratio: 20 (ref 9–23)
BUN: 15 mg/dL (ref 6–24)
CO2: 23 mmol/L (ref 20–29)
Calcium: 9.5 mg/dL (ref 8.7–10.2)
Chloride: 103 mmol/L (ref 96–106)
Creatinine, Ser: 0.75 mg/dL (ref 0.57–1.00)
Glucose: 95 mg/dL (ref 70–99)
Potassium: 4.8 mmol/L (ref 3.5–5.2)
Sodium: 141 mmol/L (ref 134–144)
eGFR: 98 mL/min/{1.73_m2} (ref >59)

## 2024-04-14 LAB — CBC WITH DIFFERENTIAL/PLATELET
Basophils Absolute: 0 10*3/uL (ref 0.0–0.2)
Basos: 1 %
EOS (ABSOLUTE): 0.1 10*3/uL (ref 0.0–0.4)
Eos: 3 %
Hematocrit: 45.6 % (ref 34.0–46.6)
Hemoglobin: 14.1 g/dL (ref 11.1–15.9)
Immature Grans (Abs): 0 10*3/uL (ref 0.0–0.1)
Immature Granulocytes: 0 %
Lymphocytes Absolute: 1.6 10*3/uL (ref 0.7–3.1)
Lymphs: 35 %
MCH: 29.9 pg (ref 26.6–33.0)
MCHC: 30.9 g/dL — ABNORMAL LOW (ref 31.5–35.7)
MCV: 97 fL (ref 79–97)
Monocytes Absolute: 0.3 10*3/uL (ref 0.1–0.9)
Monocytes: 7 %
Neutrophils Absolute: 2.4 10*3/uL (ref 1.4–7.0)
Neutrophils: 54 %
Platelets: 188 10*3/uL (ref 150–450)
RBC: 4.72 x10E6/uL (ref 3.77–5.28)
RDW: 12.8 % (ref 11.7–15.4)
WBC: 4.5 10*3/uL (ref 3.4–10.8)

## 2024-04-14 LAB — HEPATIC FUNCTION PANEL
ALT: 17 IU/L (ref 0–32)
AST: 20 IU/L (ref 0–40)
Albumin: 4.2 g/dL (ref 3.9–4.9)
Alkaline Phosphatase: 59 IU/L (ref 44–121)
Bilirubin Total: 0.5 mg/dL (ref 0.0–1.2)
Bilirubin, Direct: 0.2 mg/dL (ref 0.00–0.40)
Total Protein: 6.7 g/dL (ref 6.0–8.5)

## 2024-04-14 LAB — LIPID PANEL
Chol/HDL Ratio: 4 ratio (ref 0.0–4.4)
Cholesterol, Total: 193 mg/dL (ref 100–199)
HDL: 48 mg/dL (ref >39)
LDL Chol Calc (NIH): 130 mg/dL — ABNORMAL HIGH (ref 0–99)
Triglycerides: 81 mg/dL (ref 0–149)
VLDL Cholesterol Cal: 15 mg/dL (ref 5–40)

## 2024-04-14 LAB — HEMOGLOBIN A1C
Est. average glucose Bld gHb Est-mCnc: 94 mg/dL
Hgb A1c MFr Bld: 4.9 % (ref 4.8–5.6)

## 2024-04-14 LAB — TSH: TSH: 0.873 u[IU]/mL (ref 0.450–4.500)

## 2024-04-19 ENCOUNTER — Ambulatory Visit: Payer: Self-pay | Admitting: Internal Medicine

## 2024-08-14 ENCOUNTER — Ambulatory Visit: Admitting: Internal Medicine

## 2024-08-14 ENCOUNTER — Encounter: Payer: Self-pay | Admitting: Internal Medicine

## 2024-08-14 VITALS — BP 118/70 | HR 86 | Resp 16 | Ht 67.5 in | Wt 176.4 lb

## 2024-08-14 DIAGNOSIS — I10 Essential (primary) hypertension: Secondary | ICD-10-CM

## 2024-08-14 DIAGNOSIS — R739 Hyperglycemia, unspecified: Secondary | ICD-10-CM | POA: Diagnosis not present

## 2024-08-14 DIAGNOSIS — F439 Reaction to severe stress, unspecified: Secondary | ICD-10-CM

## 2024-08-14 DIAGNOSIS — E7800 Pure hypercholesterolemia, unspecified: Secondary | ICD-10-CM | POA: Diagnosis not present

## 2024-08-14 DIAGNOSIS — Z23 Encounter for immunization: Secondary | ICD-10-CM | POA: Diagnosis not present

## 2024-08-14 DIAGNOSIS — N951 Menopausal and female climacteric states: Secondary | ICD-10-CM

## 2024-08-14 DIAGNOSIS — Z1211 Encounter for screening for malignant neoplasm of colon: Secondary | ICD-10-CM

## 2024-08-14 DIAGNOSIS — E039 Hypothyroidism, unspecified: Secondary | ICD-10-CM

## 2024-08-14 DIAGNOSIS — K760 Fatty (change of) liver, not elsewhere classified: Secondary | ICD-10-CM

## 2024-08-14 MED ORDER — HYDROCHLOROTHIAZIDE 12.5 MG PO CAPS: ORAL_CAPSULE | ORAL | 1 refills | Status: AC

## 2024-08-14 MED ORDER — METOPROLOL SUCCINATE ER 50 MG PO TB24: ORAL_TABLET | ORAL | 1 refills | Status: AC

## 2024-08-14 MED ORDER — LEVOTHYROXINE SODIUM 150 MCG PO TABS: ORAL_TABLET | ORAL | 1 refills | Status: AC

## 2024-08-14 NOTE — Assessment & Plan Note (Signed)
 Continue diet and exercise.  On zepbound now. Has lost weight. Check liver panel today.

## 2024-08-14 NOTE — Progress Notes (Signed)
 Subjective:    Patient ID: Jenna Winters, female    DOB: 1976/02/24, 48 y.o.   MRN: [id number]  Patient here for  Chief Complaint  Patient presents with   Medical Management of Chronic Issues    HPI Here for a scheduled follow up - follow up regarding hypertension and hypercholesterolemia. Has been on zepbound - 10mg . Tolerating. Continuing to lose weight. Taking fiber - keeping bowels moving. No nausea. She is participating in a [employer] program. Discussed diet and exercise. Does report some hot flashes. Decreased libido. Discussed. Continues on zoloft . Working well for her. Overall feels good.    Past Medical History:  Diagnosis Date   Depression    Hemorrhoid    History of basal cell carcinoma (BCC) 01/29/2019   left nasal supratip, [facility]'s at [facility]   Hypertension    Hyperthyroidism 11/23/2007   s/p ablation with resulting hypothyroidism   Spitz nevus 06/25/2021   right posterior thigh   Past Surgical History:  Procedure Laterality Date   ABDOMINAL HYSTERECTOMY  08/2011   BREAST BIOPSY Left 2015   core bx fibroadenoma   COLONOSCOPY     DILATION AND CURETTAGE OF UTERUS  12/2010   DILATION AND CURETTAGE OF UTERUS  1995   HEMORRHOID SURGERY  08/2013   Family History  Problem Relation Age of Onset   Hyperlipidemia Father    Hypertension Father    Diabetes Father    Hyperlipidemia Paternal Grandmother    Hypertension Paternal Grandmother    Diabetes Paternal Grandmother    Hyperlipidemia Paternal Grandfather    Hypertension Paternal Grandfather    Diabetes Paternal Grandfather    Lung cancer Paternal Grandfather    COPD Paternal Grandfather        lung   Breast cancer Neg Hx    Social History   Socioeconomic History   Marital status: Married    Spouse name: Not on file   Number of children: 1   Years of education: Not on file   Highest education level: Not on file  Occupational History   Occupation: Event organiser: [employer]  Tobacco Use    Smoking status: Never   Smokeless tobacco: Never  Substance and Sexual Activity   Alcohol use: Yes    Alcohol/week: 0.0 standard drinks of alcohol    Comment: rare occasion   Drug use: No   Sexual activity: Not on file  Other Topics Concern   Not on file  Social History Narrative   Not on file   Social Drivers of Health   Financial Resource Strain: Not on file  Food Insecurity: Not on file  Transportation Needs: Not on file  Physical Activity: Not on file  Stress: Not on file  Social Connections: Unknown (04/05/2022)   Received [facility]   Social Network    Social Network: Not on file     Review of Systems  Constitutional:  Negative for appetite change and unexpected weight change.  HENT:  Negative for congestion and sinus pressure.   Respiratory:  Negative for cough, chest tightness and shortness of breath.   Cardiovascular:  Negative for chest pain, palpitations and leg swelling.  Gastrointestinal:  Negative for abdominal pain, diarrhea, nausea and vomiting.  Genitourinary:  Negative for difficulty urinating and dysuria.  Musculoskeletal:  Negative for joint swelling and myalgias.  Skin:  Negative for color change and rash.  Neurological:  Negative for dizziness and headaches.  Psychiatric/Behavioral:  Negative for agitation and dysphoric mood.  Objective:     BP 118/70   Pulse 86   Resp 16   Ht 5' 7.5 (1.715 m)   Wt 176 lb 6.4 oz (80 kg)   SpO2 98%   BMI 27.22 kg/m  Wt Readings from Last 3 Encounters:  08/14/24 176 lb 6.4 oz (80 kg)  04/13/24 201 lb (91.2 kg)  12/15/23 226 lb 2 oz (102.6 kg)    Physical Exam Vitals reviewed.  Constitutional:      General: She is not in acute distress.    Appearance: Normal appearance.  HENT:     Head: Normocephalic and atraumatic.     Right Ear: External ear normal.     Left Ear: External ear normal.     Mouth/Throat:     Pharynx: No oropharyngeal exudate or posterior oropharyngeal erythema.   Eyes:     General: No scleral icterus.       Right eye: No discharge.        Left eye: No discharge.     Conjunctiva/sclera: Conjunctivae normal.  Neck:     Thyroid : No thyromegaly.  Cardiovascular:     Rate and Rhythm: Normal rate and regular rhythm.  Pulmonary:     Effort: No respiratory distress.     Breath sounds: Normal breath sounds. No wheezing.  Abdominal:     General: Bowel sounds are normal.     Palpations: Abdomen is soft.     Tenderness: There is no abdominal tenderness.  Musculoskeletal:        General: No swelling or tenderness.     Cervical back: Neck supple. No tenderness.  Lymphadenopathy:     Cervical: No cervical adenopathy.  Skin:    Findings: No erythema or rash.  Neurological:     Mental Status: She is alert.  Psychiatric:        Mood and Affect: Mood normal.        Behavior: Behavior normal.         Outpatient Encounter Medications as of 08/14/2024  Medication Sig   ZEPBOUND 10 MG/0.5ML Pen Inject 10 mg into the skin once a week.   cholecalciferol (VITAMIN D ) 1000 UNITS tablet Take 2,000 Units by mouth daily.   hydrochlorothiazide  (MICROZIDE ) 12.5 MG capsule TAKE 1 CAPSULE(12.5 MG) BY MOUTH DAILY   levothyroxine  (SYNTHROID ) 150 MCG tablet TAKE 1 TABLET(150 MCG) BY MOUTH DAILY BEFORE BREAKFAST   metoprolol  succinate (TOPROL -XL) 50 MG 24 hr tablet TAKE 1 TABLET BY MOUTH DAILY, WITH OR IMMEDIATELY FOLLOWING MEALS   sertraline  (ZOLOFT ) 50 MG tablet TAKE 1 TABLET BY MOUTH DAILY   triamcinolone  cream (KENALOG ) 0.1 % Apply 1 Application topically 2 (two) times daily.   [DISCONTINUED] Calcipotriene-Betameth Diprop (ENSTILAR ) 0.005-0.064 % FOAM Apply to scalp daily as needed for psoriasis. Avoid applying to face, groin, and axilla. Use as directed. Risk of skin atrophy with long-term use reviewed.   [DISCONTINUED] hydrochlorothiazide  (MICROZIDE ) 12.5 MG capsule TAKE 1 CAPSULE(12.5 MG) BY MOUTH DAILY   [DISCONTINUED] levothyroxine  (SYNTHROID ) 150 MCG tablet  TAKE 1 TABLET(150 MCG) BY MOUTH DAILY BEFORE BREAKFAST   [DISCONTINUED] metoprolol  succinate (TOPROL -XL) 50 MG 24 hr tablet TAKE 1 TABLET BY MOUTH DAILY, WITH OR IMMEDIATELY FOLLOWING MEALS   [DISCONTINUED] ZEPBOUND 7.5 MG/0.5ML Pen Inject 7.5 mg into the skin once a week.   No facility-administered encounter medications on file as of 08/14/2024.     Lab Results  Component Value Date   WBC 4.5 04/13/2024   HGB 14.1 04/13/2024   HCT 45.6 04/13/2024  PLT 188 04/13/2024   GLUCOSE 95 04/13/2024   CHOL 193 04/13/2024   TRIG 81 04/13/2024   HDL 48 04/13/2024   LDLCALC 130 (H) 04/13/2024   ALT 17 04/13/2024   AST 20 04/13/2024   NA 141 04/13/2024   K 4.8 04/13/2024   CL 103 04/13/2024   CREATININE 0.75 04/13/2024   BUN 15 04/13/2024   CO2 23 04/13/2024   TSH 0.873 04/13/2024   HGBA1C 4.9 04/13/2024    MM 3D SCREENING MAMMOGRAM BILATERAL BREAST Result Date: 12/16/2023 CLINICAL DATA:  Screening. EXAM: DIGITAL SCREENING BILATERAL MAMMOGRAM WITH TOMOSYNTHESIS AND CAD TECHNIQUE: Bilateral screening digital craniocaudal and mediolateral oblique mammograms were obtained. Bilateral screening digital breast tomosynthesis was performed. The images were evaluated with computer-aided detection. COMPARISON:  Previous exam(s). ACR Breast Density Category a: The breasts are almost entirely fatty. FINDINGS: There are no findings suspicious for malignancy. IMPRESSION: No mammographic evidence of malignancy. A result letter of this screening mammogram will be mailed directly to the patient. RECOMMENDATION: Screening mammogram in one year. (Code:[id number]-[id number]) BI-RADS CATEGORY  1: Negative. Electronically Signed   By: Rosaline Collet M.D.   On: 12/16/2023 13:25       Assessment & Plan:  Immunization due -     Flu vaccine trivalent PF, 6mos and older(Flulaval,Afluria,Fluarix,Fluzone)  Essential hypertension, benign Assessment & Plan: Continue metoprolol  and hydrochlorothiazide . Blood pressure doing well.   Follow pressures.  No changes in medication. Check metabolic panel today.   Orders: -     Basic metabolic panel with GFR  Hyperglycemia Assessment & Plan: Has adjusted diet. Lost weight. Check met b and A1c today.   Orders: -     Basic metabolic panel with GFR -     Hemoglobin A1c  Hypercholesterolemia Assessment & Plan: Low cholesterol diet and exercise. Has adjusted diet. Lost weight. Check lipid panel today.   Orders: -     Hepatic function panel -     Lipid panel -     CBC with Differential/Platelet  Perimenopausal vasomotor symptoms Assessment & Plan: Discussed hot flashes. Discussed decreased libido. Will continue zoloft . Exercise. Check FSH. Follow.   Orders: -     Follicle stimulating hormone  Fatty infiltration of liver Assessment & Plan: Continue diet and exercise.  On zepbound now. Has lost weight. Check liver panel today.    Hypothyroidism (acquired) Assessment & Plan: Continues on synthroid . Check tsh today.    Screen for colon cancer Assessment & Plan: Referral has been placed. F/u on getting scheduled for screening colonoscopy.    Stress Assessment & Plan: Continue zoloft . Discussed. Overall doing well. Follow.    Other orders -     hydroCHLOROthiazide ; TAKE 1 CAPSULE(12.5 MG) BY MOUTH DAILY  Dispense: 90 capsule; Refill: 1 -     Levothyroxine  Sodium; TAKE 1 TABLET(150 MCG) BY MOUTH DAILY BEFORE BREAKFAST  Dispense: 90 tablet; Refill: 1 -     Metoprolol  Succinate ER; TAKE 1 TABLET BY MOUTH DAILY, WITH OR IMMEDIATELY FOLLOWING MEALS  Dispense: 90 tablet; Refill: 1     Allena Hamilton, MD

## 2024-08-14 NOTE — Assessment & Plan Note (Signed)
 Continues on synthroid.   Check tsh today.

## 2024-08-14 NOTE — Assessment & Plan Note (Signed)
 Has adjusted diet. Lost weight. Check met b and A1c today.

## 2024-08-14 NOTE — Assessment & Plan Note (Signed)
 Referral has been placed. F/u on getting scheduled for screening colonoscopy.

## 2024-08-14 NOTE — Assessment & Plan Note (Signed)
 Discussed hot flashes. Discussed decreased libido. Will continue zoloft . Exercise. Check FSH. Follow.

## 2024-08-14 NOTE — Assessment & Plan Note (Signed)
 Continue zoloft . Discussed. Overall doing well. Follow.

## 2024-08-14 NOTE — Patient Instructions (Signed)
  [facility] [phone] option 1

## 2024-08-14 NOTE — Assessment & Plan Note (Signed)
 Continue metoprolol  and hydrochlorothiazide . Blood pressure doing well.  Follow pressures.  No changes in medication. Check metabolic panel today.

## 2024-08-14 NOTE — Assessment & Plan Note (Signed)
 Low cholesterol diet and exercise. Has adjusted diet. Lost weight. Check lipid panel today.

## 2024-08-15 LAB — LIPID PANEL
Chol/HDL Ratio: 3.7 ratio (ref 0.0–4.4)
Cholesterol, Total: 173 mg/dL (ref 100–199)
HDL: 47 mg/dL (ref >39)
LDL Chol Calc (NIH): 113 mg/dL — ABNORMAL HIGH (ref 0–99)
Triglycerides: 70 mg/dL (ref 0–149)
VLDL Cholesterol Cal: 13 mg/dL (ref 5–40)

## 2024-08-15 LAB — CBC WITH DIFFERENTIAL/PLATELET
Basophils Absolute: 0 x10E3/uL (ref 0.0–0.2)
Basos: 1 %
EOS (ABSOLUTE): 0.1 x10E3/uL (ref 0.0–0.4)
Eos: 3 %
Hematocrit: 42.2 % (ref 34.0–46.6)
Hemoglobin: 13.2 g/dL (ref 11.1–15.9)
Immature Grans (Abs): 0 x10E3/uL (ref 0.0–0.1)
Immature Granulocytes: 0 %
Lymphocytes Absolute: 1.5 x10E3/uL (ref 0.7–3.1)
Lymphs: 37 %
MCH: 29.9 pg (ref 26.6–33.0)
MCHC: 31.3 g/dL — ABNORMAL LOW (ref 31.5–35.7)
MCV: 96 fL (ref 79–97)
Monocytes Absolute: 0.4 x10E3/uL (ref 0.1–0.9)
Monocytes: 10 %
Neutrophils Absolute: 2 x10E3/uL (ref 1.4–7.0)
Neutrophils: 49 %
Platelets: 187 x10E3/uL (ref 150–450)
RBC: 4.42 x10E6/uL (ref 3.77–5.28)
RDW: 11.8 % (ref 11.7–15.4)
WBC: 4 x10E3/uL (ref 3.4–10.8)

## 2024-08-15 LAB — BASIC METABOLIC PANEL WITH GFR
BUN/Creatinine Ratio: 25 — ABNORMAL HIGH (ref 9–23)
BUN: 16 mg/dL (ref 6–24)
CO2: 24 mmol/L (ref 20–29)
Calcium: 9.4 mg/dL (ref 8.7–10.2)
Chloride: 102 mmol/L (ref 96–106)
Creatinine, Ser: 0.65 mg/dL (ref 0.57–1.00)
Glucose: 91 mg/dL (ref 70–99)
Potassium: 4.2 mmol/L (ref 3.5–5.2)
Sodium: 141 mmol/L (ref 134–144)
eGFR: 109 mL/min/1.73 (ref >59)

## 2024-08-15 LAB — HEMOGLOBIN A1C
Est. average glucose Bld gHb Est-mCnc: 97 mg/dL
Hgb A1c MFr Bld: 5 % (ref 4.8–5.6)

## 2024-08-15 LAB — HEPATIC FUNCTION PANEL
ALT: 12 IU/L (ref 0–32)
AST: 14 IU/L (ref 0–40)
Albumin: 4.2 g/dL (ref 3.9–4.9)
Alkaline Phosphatase: 63 IU/L (ref 41–116)
Bilirubin Total: 0.6 mg/dL (ref 0.0–1.2)
Bilirubin, Direct: 0.2 mg/dL (ref 0.00–0.40)
Total Protein: 6.8 g/dL (ref 6.0–8.5)

## 2024-08-15 LAB — FOLLICLE STIMULATING HORMONE: FSH: 72.3 m[IU]/mL

## 2024-08-16 ENCOUNTER — Ambulatory Visit: Payer: Self-pay | Admitting: Internal Medicine

## 2024-12-17 ENCOUNTER — Encounter: Admitting: Internal Medicine

## 2025-02-15 ENCOUNTER — Encounter: Admitting: Internal Medicine
